# Patient Record
Sex: Male | Born: 1935 | Race: Black or African American | Hispanic: No | Marital: Married | Smoking: Former smoker
Health system: Southern US, Community
[De-identification: ages and names within clinical notes are randomized; demographics above are authoritative.]

## PROBLEM LIST (undated history)

## (undated) DIAGNOSIS — I2510 Atherosclerotic heart disease of native coronary artery without angina pectoris: Secondary | ICD-10-CM

## (undated) DIAGNOSIS — C801 Malignant (primary) neoplasm, unspecified: Secondary | ICD-10-CM

## (undated) HISTORY — PX: COLON SURGERY: SHX602

## (undated) HISTORY — PX: CORONARY ARTERY BYPASS GRAFT: SHX141

---

## 2015-11-02 DIAGNOSIS — Z85038 Personal history of other malignant neoplasm of large intestine: Secondary | ICD-10-CM | POA: Diagnosis not present

## 2015-12-07 DIAGNOSIS — C186 Malignant neoplasm of descending colon: Secondary | ICD-10-CM | POA: Diagnosis not present

## 2015-12-07 DIAGNOSIS — Z933 Colostomy status: Secondary | ICD-10-CM | POA: Diagnosis not present

## 2016-04-12 DIAGNOSIS — R970 Elevated carcinoembryonic antigen [CEA]: Secondary | ICD-10-CM | POA: Diagnosis not present

## 2016-04-12 DIAGNOSIS — Z85038 Personal history of other malignant neoplasm of large intestine: Secondary | ICD-10-CM | POA: Diagnosis not present

## 2016-04-27 DIAGNOSIS — C186 Malignant neoplasm of descending colon: Secondary | ICD-10-CM | POA: Diagnosis not present

## 2016-04-27 DIAGNOSIS — K6389 Other specified diseases of intestine: Secondary | ICD-10-CM | POA: Diagnosis not present

## 2016-06-08 DIAGNOSIS — C189 Malignant neoplasm of colon, unspecified: Secondary | ICD-10-CM | POA: Diagnosis not present

## 2017-01-01 DIAGNOSIS — K56609 Unspecified intestinal obstruction, unspecified as to partial versus complete obstruction: Secondary | ICD-10-CM

## 2017-01-01 DIAGNOSIS — I10 Essential (primary) hypertension: Secondary | ICD-10-CM | POA: Diagnosis not present

## 2017-01-01 DIAGNOSIS — E875 Hyperkalemia: Secondary | ICD-10-CM

## 2017-01-02 DIAGNOSIS — I10 Essential (primary) hypertension: Secondary | ICD-10-CM | POA: Diagnosis not present

## 2017-01-02 DIAGNOSIS — E875 Hyperkalemia: Secondary | ICD-10-CM | POA: Diagnosis not present

## 2017-01-02 DIAGNOSIS — K56609 Unspecified intestinal obstruction, unspecified as to partial versus complete obstruction: Secondary | ICD-10-CM | POA: Diagnosis not present

## 2017-01-03 DIAGNOSIS — I10 Essential (primary) hypertension: Secondary | ICD-10-CM | POA: Diagnosis not present

## 2017-01-03 DIAGNOSIS — K56609 Unspecified intestinal obstruction, unspecified as to partial versus complete obstruction: Secondary | ICD-10-CM | POA: Diagnosis not present

## 2017-01-03 DIAGNOSIS — E875 Hyperkalemia: Secondary | ICD-10-CM | POA: Diagnosis not present

## 2017-01-04 DIAGNOSIS — I10 Essential (primary) hypertension: Secondary | ICD-10-CM | POA: Diagnosis not present

## 2017-01-04 DIAGNOSIS — K56609 Unspecified intestinal obstruction, unspecified as to partial versus complete obstruction: Secondary | ICD-10-CM | POA: Diagnosis not present

## 2017-01-04 DIAGNOSIS — E875 Hyperkalemia: Secondary | ICD-10-CM | POA: Diagnosis not present

## 2017-01-05 DIAGNOSIS — I10 Essential (primary) hypertension: Secondary | ICD-10-CM | POA: Diagnosis not present

## 2017-01-05 DIAGNOSIS — K56609 Unspecified intestinal obstruction, unspecified as to partial versus complete obstruction: Secondary | ICD-10-CM | POA: Diagnosis not present

## 2017-01-05 DIAGNOSIS — E875 Hyperkalemia: Secondary | ICD-10-CM | POA: Diagnosis not present

## 2017-01-06 DIAGNOSIS — I10 Essential (primary) hypertension: Secondary | ICD-10-CM | POA: Diagnosis not present

## 2017-01-06 DIAGNOSIS — K56609 Unspecified intestinal obstruction, unspecified as to partial versus complete obstruction: Secondary | ICD-10-CM | POA: Diagnosis not present

## 2017-01-06 DIAGNOSIS — E875 Hyperkalemia: Secondary | ICD-10-CM | POA: Diagnosis not present

## 2017-01-07 DIAGNOSIS — K56609 Unspecified intestinal obstruction, unspecified as to partial versus complete obstruction: Secondary | ICD-10-CM | POA: Diagnosis not present

## 2017-01-07 DIAGNOSIS — E875 Hyperkalemia: Secondary | ICD-10-CM | POA: Diagnosis not present

## 2017-01-07 DIAGNOSIS — I10 Essential (primary) hypertension: Secondary | ICD-10-CM | POA: Diagnosis not present

## 2017-01-08 DIAGNOSIS — K56609 Unspecified intestinal obstruction, unspecified as to partial versus complete obstruction: Secondary | ICD-10-CM | POA: Diagnosis not present

## 2017-01-08 DIAGNOSIS — E875 Hyperkalemia: Secondary | ICD-10-CM | POA: Diagnosis not present

## 2017-01-08 DIAGNOSIS — I10 Essential (primary) hypertension: Secondary | ICD-10-CM | POA: Diagnosis not present

## 2017-01-10 DIAGNOSIS — E875 Hyperkalemia: Secondary | ICD-10-CM | POA: Diagnosis not present

## 2017-01-10 DIAGNOSIS — K56609 Unspecified intestinal obstruction, unspecified as to partial versus complete obstruction: Secondary | ICD-10-CM | POA: Diagnosis not present

## 2017-01-10 DIAGNOSIS — I10 Essential (primary) hypertension: Secondary | ICD-10-CM | POA: Diagnosis not present

## 2017-07-19 DIAGNOSIS — K56609 Unspecified intestinal obstruction, unspecified as to partial versus complete obstruction: Secondary | ICD-10-CM | POA: Diagnosis not present

## 2017-07-20 DIAGNOSIS — K56609 Unspecified intestinal obstruction, unspecified as to partial versus complete obstruction: Secondary | ICD-10-CM | POA: Diagnosis not present

## 2017-07-21 DIAGNOSIS — K56609 Unspecified intestinal obstruction, unspecified as to partial versus complete obstruction: Secondary | ICD-10-CM | POA: Diagnosis not present

## 2017-07-22 DIAGNOSIS — K56609 Unspecified intestinal obstruction, unspecified as to partial versus complete obstruction: Secondary | ICD-10-CM | POA: Diagnosis not present

## 2017-07-23 DIAGNOSIS — K56609 Unspecified intestinal obstruction, unspecified as to partial versus complete obstruction: Secondary | ICD-10-CM | POA: Diagnosis not present

## 2017-08-17 DIAGNOSIS — I2510 Atherosclerotic heart disease of native coronary artery without angina pectoris: Secondary | ICD-10-CM | POA: Diagnosis not present

## 2017-09-05 DIAGNOSIS — C189 Malignant neoplasm of colon, unspecified: Secondary | ICD-10-CM

## 2017-09-13 DIAGNOSIS — C786 Secondary malignant neoplasm of retroperitoneum and peritoneum: Secondary | ICD-10-CM | POA: Diagnosis not present

## 2017-09-13 DIAGNOSIS — C189 Malignant neoplasm of colon, unspecified: Secondary | ICD-10-CM | POA: Diagnosis not present

## 2017-09-21 DIAGNOSIS — C786 Secondary malignant neoplasm of retroperitoneum and peritoneum: Secondary | ICD-10-CM | POA: Diagnosis not present

## 2017-09-21 DIAGNOSIS — C189 Malignant neoplasm of colon, unspecified: Secondary | ICD-10-CM | POA: Diagnosis not present

## 2017-10-22 DIAGNOSIS — C786 Secondary malignant neoplasm of retroperitoneum and peritoneum: Secondary | ICD-10-CM | POA: Diagnosis not present

## 2017-10-22 DIAGNOSIS — C189 Malignant neoplasm of colon, unspecified: Secondary | ICD-10-CM | POA: Diagnosis not present

## 2017-10-22 DIAGNOSIS — K6289 Other specified diseases of anus and rectum: Secondary | ICD-10-CM | POA: Diagnosis not present

## 2017-11-05 DIAGNOSIS — C786 Secondary malignant neoplasm of retroperitoneum and peritoneum: Secondary | ICD-10-CM

## 2017-11-05 DIAGNOSIS — C189 Malignant neoplasm of colon, unspecified: Secondary | ICD-10-CM | POA: Diagnosis not present

## 2017-11-21 DIAGNOSIS — C786 Secondary malignant neoplasm of retroperitoneum and peritoneum: Secondary | ICD-10-CM

## 2017-11-21 DIAGNOSIS — C189 Malignant neoplasm of colon, unspecified: Secondary | ICD-10-CM | POA: Diagnosis not present

## 2017-11-21 DIAGNOSIS — R197 Diarrhea, unspecified: Secondary | ICD-10-CM | POA: Diagnosis not present

## 2017-11-21 DIAGNOSIS — Z933 Colostomy status: Secondary | ICD-10-CM

## 2017-11-21 DIAGNOSIS — E860 Dehydration: Secondary | ICD-10-CM | POA: Diagnosis not present

## 2017-12-12 DIAGNOSIS — C786 Secondary malignant neoplasm of retroperitoneum and peritoneum: Secondary | ICD-10-CM

## 2017-12-12 DIAGNOSIS — C189 Malignant neoplasm of colon, unspecified: Secondary | ICD-10-CM

## 2017-12-12 DIAGNOSIS — Z933 Colostomy status: Secondary | ICD-10-CM

## 2017-12-12 DIAGNOSIS — R197 Diarrhea, unspecified: Secondary | ICD-10-CM | POA: Diagnosis not present

## 2017-12-14 DIAGNOSIS — Z452 Encounter for adjustment and management of vascular access device: Secondary | ICD-10-CM | POA: Diagnosis not present

## 2017-12-14 DIAGNOSIS — C186 Malignant neoplasm of descending colon: Secondary | ICD-10-CM | POA: Diagnosis not present

## 2017-12-26 DIAGNOSIS — C186 Malignant neoplasm of descending colon: Secondary | ICD-10-CM | POA: Diagnosis not present

## 2017-12-26 DIAGNOSIS — D709 Neutropenia, unspecified: Secondary | ICD-10-CM | POA: Diagnosis not present

## 2017-12-26 DIAGNOSIS — D701 Agranulocytosis secondary to cancer chemotherapy: Secondary | ICD-10-CM | POA: Diagnosis not present

## 2017-12-26 DIAGNOSIS — C786 Secondary malignant neoplasm of retroperitoneum and peritoneum: Secondary | ICD-10-CM | POA: Diagnosis not present

## 2017-12-26 DIAGNOSIS — C189 Malignant neoplasm of colon, unspecified: Secondary | ICD-10-CM | POA: Diagnosis not present

## 2018-01-02 DIAGNOSIS — C186 Malignant neoplasm of descending colon: Secondary | ICD-10-CM | POA: Diagnosis not present

## 2018-01-03 DIAGNOSIS — Z5189 Encounter for other specified aftercare: Secondary | ICD-10-CM | POA: Diagnosis not present

## 2018-01-03 DIAGNOSIS — C186 Malignant neoplasm of descending colon: Secondary | ICD-10-CM | POA: Diagnosis not present

## 2018-01-09 DIAGNOSIS — Z5189 Encounter for other specified aftercare: Secondary | ICD-10-CM | POA: Diagnosis not present

## 2018-01-09 DIAGNOSIS — Z5111 Encounter for antineoplastic chemotherapy: Secondary | ICD-10-CM | POA: Diagnosis not present

## 2018-01-09 DIAGNOSIS — C186 Malignant neoplasm of descending colon: Secondary | ICD-10-CM | POA: Diagnosis not present

## 2018-01-11 DIAGNOSIS — C186 Malignant neoplasm of descending colon: Secondary | ICD-10-CM | POA: Diagnosis not present

## 2018-01-23 DIAGNOSIS — Z85038 Personal history of other malignant neoplasm of large intestine: Secondary | ICD-10-CM | POA: Diagnosis not present

## 2018-01-23 DIAGNOSIS — Z933 Colostomy status: Secondary | ICD-10-CM | POA: Diagnosis not present

## 2018-01-25 DIAGNOSIS — Z933 Colostomy status: Secondary | ICD-10-CM | POA: Diagnosis not present

## 2018-01-25 DIAGNOSIS — Z85038 Personal history of other malignant neoplasm of large intestine: Secondary | ICD-10-CM | POA: Diagnosis not present

## 2018-02-02 DIAGNOSIS — C186 Malignant neoplasm of descending colon: Secondary | ICD-10-CM | POA: Diagnosis not present

## 2018-02-04 DIAGNOSIS — D701 Agranulocytosis secondary to cancer chemotherapy: Secondary | ICD-10-CM | POA: Diagnosis not present

## 2018-02-04 DIAGNOSIS — D709 Neutropenia, unspecified: Secondary | ICD-10-CM | POA: Diagnosis not present

## 2018-02-04 DIAGNOSIS — C786 Secondary malignant neoplasm of retroperitoneum and peritoneum: Secondary | ICD-10-CM | POA: Diagnosis not present

## 2018-02-04 DIAGNOSIS — C186 Malignant neoplasm of descending colon: Secondary | ICD-10-CM | POA: Diagnosis not present

## 2018-02-04 DIAGNOSIS — C189 Malignant neoplasm of colon, unspecified: Secondary | ICD-10-CM | POA: Diagnosis not present

## 2018-02-06 DIAGNOSIS — Z5189 Encounter for other specified aftercare: Secondary | ICD-10-CM | POA: Diagnosis not present

## 2018-02-06 DIAGNOSIS — C186 Malignant neoplasm of descending colon: Secondary | ICD-10-CM | POA: Diagnosis not present

## 2018-02-25 DIAGNOSIS — C786 Secondary malignant neoplasm of retroperitoneum and peritoneum: Secondary | ICD-10-CM | POA: Diagnosis not present

## 2018-02-25 DIAGNOSIS — C186 Malignant neoplasm of descending colon: Secondary | ICD-10-CM | POA: Diagnosis not present

## 2018-02-25 DIAGNOSIS — D701 Agranulocytosis secondary to cancer chemotherapy: Secondary | ICD-10-CM | POA: Diagnosis not present

## 2018-02-25 DIAGNOSIS — C189 Malignant neoplasm of colon, unspecified: Secondary | ICD-10-CM | POA: Diagnosis not present

## 2018-02-25 DIAGNOSIS — Z79899 Other long term (current) drug therapy: Secondary | ICD-10-CM | POA: Diagnosis not present

## 2018-02-25 DIAGNOSIS — M25552 Pain in left hip: Secondary | ICD-10-CM | POA: Diagnosis not present

## 2018-02-27 DIAGNOSIS — Z5189 Encounter for other specified aftercare: Secondary | ICD-10-CM | POA: Diagnosis not present

## 2018-02-27 DIAGNOSIS — C186 Malignant neoplasm of descending colon: Secondary | ICD-10-CM | POA: Diagnosis not present

## 2018-03-04 DIAGNOSIS — C186 Malignant neoplasm of descending colon: Secondary | ICD-10-CM | POA: Diagnosis not present

## 2018-03-07 DIAGNOSIS — M25552 Pain in left hip: Secondary | ICD-10-CM | POA: Diagnosis not present

## 2018-03-07 DIAGNOSIS — I739 Peripheral vascular disease, unspecified: Secondary | ICD-10-CM | POA: Diagnosis not present

## 2018-03-07 DIAGNOSIS — M79605 Pain in left leg: Secondary | ICD-10-CM | POA: Diagnosis not present

## 2018-03-07 DIAGNOSIS — I10 Essential (primary) hypertension: Secondary | ICD-10-CM | POA: Diagnosis not present

## 2018-03-07 DIAGNOSIS — Z6825 Body mass index (BMI) 25.0-25.9, adult: Secondary | ICD-10-CM | POA: Diagnosis not present

## 2018-03-07 DIAGNOSIS — Z8546 Personal history of malignant neoplasm of prostate: Secondary | ICD-10-CM | POA: Diagnosis not present

## 2018-03-08 DIAGNOSIS — M47816 Spondylosis without myelopathy or radiculopathy, lumbar region: Secondary | ICD-10-CM | POA: Diagnosis not present

## 2018-03-08 DIAGNOSIS — M25552 Pain in left hip: Secondary | ICD-10-CM | POA: Diagnosis not present

## 2018-03-08 DIAGNOSIS — M160 Bilateral primary osteoarthritis of hip: Secondary | ICD-10-CM | POA: Diagnosis not present

## 2018-03-08 DIAGNOSIS — M79605 Pain in left leg: Secondary | ICD-10-CM | POA: Diagnosis not present

## 2018-03-15 DIAGNOSIS — C186 Malignant neoplasm of descending colon: Secondary | ICD-10-CM | POA: Diagnosis not present

## 2018-03-15 DIAGNOSIS — M47816 Spondylosis without myelopathy or radiculopathy, lumbar region: Secondary | ICD-10-CM | POA: Diagnosis not present

## 2018-03-15 DIAGNOSIS — Z933 Colostomy status: Secondary | ICD-10-CM | POA: Diagnosis not present

## 2018-03-15 DIAGNOSIS — I700 Atherosclerosis of aorta: Secondary | ICD-10-CM | POA: Diagnosis not present

## 2018-03-15 DIAGNOSIS — C189 Malignant neoplasm of colon, unspecified: Secondary | ICD-10-CM | POA: Diagnosis not present

## 2018-03-18 DIAGNOSIS — C189 Malignant neoplasm of colon, unspecified: Secondary | ICD-10-CM | POA: Diagnosis not present

## 2018-03-18 DIAGNOSIS — C786 Secondary malignant neoplasm of retroperitoneum and peritoneum: Secondary | ICD-10-CM | POA: Diagnosis not present

## 2018-03-18 DIAGNOSIS — K6289 Other specified diseases of anus and rectum: Secondary | ICD-10-CM | POA: Diagnosis not present

## 2018-03-18 DIAGNOSIS — C186 Malignant neoplasm of descending colon: Secondary | ICD-10-CM | POA: Diagnosis not present

## 2018-03-20 DIAGNOSIS — Z5189 Encounter for other specified aftercare: Secondary | ICD-10-CM | POA: Diagnosis not present

## 2018-03-20 DIAGNOSIS — C186 Malignant neoplasm of descending colon: Secondary | ICD-10-CM | POA: Diagnosis not present

## 2018-04-04 DIAGNOSIS — Z8546 Personal history of malignant neoplasm of prostate: Secondary | ICD-10-CM | POA: Diagnosis not present

## 2018-04-04 DIAGNOSIS — Z6824 Body mass index (BMI) 24.0-24.9, adult: Secondary | ICD-10-CM | POA: Diagnosis not present

## 2018-04-04 DIAGNOSIS — Z0289 Encounter for other administrative examinations: Secondary | ICD-10-CM | POA: Diagnosis not present

## 2018-04-04 DIAGNOSIS — L400 Psoriasis vulgaris: Secondary | ICD-10-CM | POA: Diagnosis not present

## 2018-04-04 DIAGNOSIS — C186 Malignant neoplasm of descending colon: Secondary | ICD-10-CM | POA: Diagnosis not present

## 2018-04-04 DIAGNOSIS — I10 Essential (primary) hypertension: Secondary | ICD-10-CM | POA: Diagnosis not present

## 2018-04-05 DIAGNOSIS — M1612 Unilateral primary osteoarthritis, left hip: Secondary | ICD-10-CM | POA: Diagnosis not present

## 2018-04-05 DIAGNOSIS — M79605 Pain in left leg: Secondary | ICD-10-CM | POA: Diagnosis not present

## 2018-04-08 DIAGNOSIS — L400 Psoriasis vulgaris: Secondary | ICD-10-CM | POA: Diagnosis not present

## 2018-04-08 DIAGNOSIS — R42 Dizziness and giddiness: Secondary | ICD-10-CM | POA: Diagnosis not present

## 2018-04-08 DIAGNOSIS — R944 Abnormal results of kidney function studies: Secondary | ICD-10-CM | POA: Diagnosis not present

## 2018-04-08 DIAGNOSIS — C186 Malignant neoplasm of descending colon: Secondary | ICD-10-CM | POA: Diagnosis not present

## 2018-04-08 DIAGNOSIS — C189 Malignant neoplasm of colon, unspecified: Secondary | ICD-10-CM | POA: Diagnosis not present

## 2018-04-08 DIAGNOSIS — C786 Secondary malignant neoplasm of retroperitoneum and peritoneum: Secondary | ICD-10-CM | POA: Diagnosis not present

## 2018-04-10 DIAGNOSIS — C186 Malignant neoplasm of descending colon: Secondary | ICD-10-CM | POA: Diagnosis not present

## 2018-04-10 DIAGNOSIS — Z5189 Encounter for other specified aftercare: Secondary | ICD-10-CM | POA: Diagnosis not present

## 2018-04-15 DIAGNOSIS — M1612 Unilateral primary osteoarthritis, left hip: Secondary | ICD-10-CM | POA: Diagnosis not present

## 2018-04-18 DIAGNOSIS — Z933 Colostomy status: Secondary | ICD-10-CM | POA: Diagnosis not present

## 2018-04-18 DIAGNOSIS — Z85038 Personal history of other malignant neoplasm of large intestine: Secondary | ICD-10-CM | POA: Diagnosis not present

## 2018-04-29 DIAGNOSIS — C786 Secondary malignant neoplasm of retroperitoneum and peritoneum: Secondary | ICD-10-CM | POA: Diagnosis not present

## 2018-04-29 DIAGNOSIS — C186 Malignant neoplasm of descending colon: Secondary | ICD-10-CM | POA: Diagnosis not present

## 2018-04-29 DIAGNOSIS — K521 Toxic gastroenteritis and colitis: Secondary | ICD-10-CM | POA: Diagnosis not present

## 2018-04-29 DIAGNOSIS — C189 Malignant neoplasm of colon, unspecified: Secondary | ICD-10-CM | POA: Diagnosis not present

## 2018-04-29 DIAGNOSIS — Z933 Colostomy status: Secondary | ICD-10-CM | POA: Diagnosis not present

## 2018-04-29 DIAGNOSIS — Z5189 Encounter for other specified aftercare: Secondary | ICD-10-CM | POA: Diagnosis not present

## 2018-04-29 DIAGNOSIS — R197 Diarrhea, unspecified: Secondary | ICD-10-CM | POA: Diagnosis not present

## 2018-05-01 DIAGNOSIS — Z5189 Encounter for other specified aftercare: Secondary | ICD-10-CM | POA: Diagnosis not present

## 2018-05-01 DIAGNOSIS — C186 Malignant neoplasm of descending colon: Secondary | ICD-10-CM | POA: Diagnosis not present

## 2018-05-04 DIAGNOSIS — C186 Malignant neoplasm of descending colon: Secondary | ICD-10-CM | POA: Diagnosis not present

## 2018-05-27 DIAGNOSIS — C186 Malignant neoplasm of descending colon: Secondary | ICD-10-CM | POA: Diagnosis not present

## 2018-05-27 DIAGNOSIS — C189 Malignant neoplasm of colon, unspecified: Secondary | ICD-10-CM | POA: Diagnosis not present

## 2018-05-27 DIAGNOSIS — C786 Secondary malignant neoplasm of retroperitoneum and peritoneum: Secondary | ICD-10-CM | POA: Diagnosis not present

## 2018-05-28 DIAGNOSIS — I10 Essential (primary) hypertension: Secondary | ICD-10-CM | POA: Diagnosis not present

## 2018-05-28 DIAGNOSIS — Z951 Presence of aortocoronary bypass graft: Secondary | ICD-10-CM | POA: Diagnosis not present

## 2018-05-28 DIAGNOSIS — C186 Malignant neoplasm of descending colon: Secondary | ICD-10-CM | POA: Diagnosis not present

## 2018-05-28 DIAGNOSIS — E782 Mixed hyperlipidemia: Secondary | ICD-10-CM | POA: Diagnosis not present

## 2018-05-28 DIAGNOSIS — I2510 Atherosclerotic heart disease of native coronary artery without angina pectoris: Secondary | ICD-10-CM | POA: Diagnosis not present

## 2018-05-28 DIAGNOSIS — Z7982 Long term (current) use of aspirin: Secondary | ICD-10-CM | POA: Diagnosis not present

## 2018-05-29 DIAGNOSIS — C186 Malignant neoplasm of descending colon: Secondary | ICD-10-CM | POA: Diagnosis not present

## 2018-05-29 DIAGNOSIS — Z5189 Encounter for other specified aftercare: Secondary | ICD-10-CM | POA: Diagnosis not present

## 2018-06-04 DIAGNOSIS — C186 Malignant neoplasm of descending colon: Secondary | ICD-10-CM | POA: Diagnosis not present

## 2018-06-12 DIAGNOSIS — H401111 Primary open-angle glaucoma, right eye, mild stage: Secondary | ICD-10-CM | POA: Diagnosis not present

## 2018-06-14 DIAGNOSIS — C189 Malignant neoplasm of colon, unspecified: Secondary | ICD-10-CM | POA: Diagnosis not present

## 2018-06-14 DIAGNOSIS — Z77090 Contact with and (suspected) exposure to asbestos: Secondary | ICD-10-CM | POA: Diagnosis not present

## 2018-06-14 DIAGNOSIS — J948 Other specified pleural conditions: Secondary | ICD-10-CM | POA: Diagnosis not present

## 2018-06-14 DIAGNOSIS — C186 Malignant neoplasm of descending colon: Secondary | ICD-10-CM | POA: Diagnosis not present

## 2018-06-17 DIAGNOSIS — C786 Secondary malignant neoplasm of retroperitoneum and peritoneum: Secondary | ICD-10-CM | POA: Diagnosis not present

## 2018-06-17 DIAGNOSIS — Z515 Encounter for palliative care: Secondary | ICD-10-CM | POA: Diagnosis not present

## 2018-06-17 DIAGNOSIS — C189 Malignant neoplasm of colon, unspecified: Secondary | ICD-10-CM | POA: Diagnosis not present

## 2018-06-17 DIAGNOSIS — R970 Elevated carcinoembryonic antigen [CEA]: Secondary | ICD-10-CM | POA: Diagnosis not present

## 2018-06-17 DIAGNOSIS — C186 Malignant neoplasm of descending colon: Secondary | ICD-10-CM | POA: Diagnosis not present

## 2018-06-19 DIAGNOSIS — Z5189 Encounter for other specified aftercare: Secondary | ICD-10-CM | POA: Diagnosis not present

## 2018-06-19 DIAGNOSIS — C186 Malignant neoplasm of descending colon: Secondary | ICD-10-CM | POA: Diagnosis not present

## 2018-06-27 DIAGNOSIS — H43811 Vitreous degeneration, right eye: Secondary | ICD-10-CM | POA: Diagnosis not present

## 2018-06-27 DIAGNOSIS — H4311 Vitreous hemorrhage, right eye: Secondary | ICD-10-CM | POA: Diagnosis not present

## 2018-06-27 DIAGNOSIS — H43813 Vitreous degeneration, bilateral: Secondary | ICD-10-CM | POA: Diagnosis not present

## 2018-06-27 DIAGNOSIS — H43812 Vitreous degeneration, left eye: Secondary | ICD-10-CM | POA: Diagnosis not present

## 2018-07-05 DIAGNOSIS — C186 Malignant neoplasm of descending colon: Secondary | ICD-10-CM | POA: Diagnosis not present

## 2018-07-10 DIAGNOSIS — C189 Malignant neoplasm of colon, unspecified: Secondary | ICD-10-CM | POA: Diagnosis not present

## 2018-07-10 DIAGNOSIS — C786 Secondary malignant neoplasm of retroperitoneum and peritoneum: Secondary | ICD-10-CM | POA: Diagnosis not present

## 2018-07-10 DIAGNOSIS — C186 Malignant neoplasm of descending colon: Secondary | ICD-10-CM | POA: Diagnosis not present

## 2018-07-11 DIAGNOSIS — H35031 Hypertensive retinopathy, right eye: Secondary | ICD-10-CM | POA: Diagnosis not present

## 2018-07-11 DIAGNOSIS — H4311 Vitreous hemorrhage, right eye: Secondary | ICD-10-CM | POA: Diagnosis not present

## 2018-07-11 DIAGNOSIS — H43811 Vitreous degeneration, right eye: Secondary | ICD-10-CM | POA: Diagnosis not present

## 2018-07-12 DIAGNOSIS — C186 Malignant neoplasm of descending colon: Secondary | ICD-10-CM | POA: Diagnosis not present

## 2018-07-12 DIAGNOSIS — Z5189 Encounter for other specified aftercare: Secondary | ICD-10-CM | POA: Diagnosis not present

## 2018-07-31 DIAGNOSIS — Z0001 Encounter for general adult medical examination with abnormal findings: Secondary | ICD-10-CM | POA: Diagnosis not present

## 2018-07-31 DIAGNOSIS — C786 Secondary malignant neoplasm of retroperitoneum and peritoneum: Secondary | ICD-10-CM

## 2018-07-31 DIAGNOSIS — D649 Anemia, unspecified: Secondary | ICD-10-CM | POA: Diagnosis not present

## 2018-07-31 DIAGNOSIS — C186 Malignant neoplasm of descending colon: Secondary | ICD-10-CM | POA: Diagnosis not present

## 2018-07-31 DIAGNOSIS — C189 Malignant neoplasm of colon, unspecified: Secondary | ICD-10-CM

## 2018-08-02 DIAGNOSIS — C186 Malignant neoplasm of descending colon: Secondary | ICD-10-CM | POA: Diagnosis not present

## 2018-08-02 DIAGNOSIS — Z5189 Encounter for other specified aftercare: Secondary | ICD-10-CM | POA: Diagnosis not present

## 2018-08-02 DIAGNOSIS — Z23 Encounter for immunization: Secondary | ICD-10-CM | POA: Diagnosis not present

## 2018-08-03 DIAGNOSIS — C186 Malignant neoplasm of descending colon: Secondary | ICD-10-CM | POA: Diagnosis not present

## 2018-08-28 DIAGNOSIS — C186 Malignant neoplasm of descending colon: Secondary | ICD-10-CM | POA: Diagnosis not present

## 2018-08-28 DIAGNOSIS — E860 Dehydration: Secondary | ICD-10-CM | POA: Diagnosis not present

## 2018-08-30 DIAGNOSIS — C186 Malignant neoplasm of descending colon: Secondary | ICD-10-CM | POA: Diagnosis not present

## 2018-08-30 DIAGNOSIS — Z5189 Encounter for other specified aftercare: Secondary | ICD-10-CM | POA: Diagnosis not present

## 2018-09-03 DIAGNOSIS — C186 Malignant neoplasm of descending colon: Secondary | ICD-10-CM | POA: Diagnosis not present

## 2018-09-16 DIAGNOSIS — C189 Malignant neoplasm of colon, unspecified: Secondary | ICD-10-CM | POA: Diagnosis not present

## 2018-09-16 DIAGNOSIS — C186 Malignant neoplasm of descending colon: Secondary | ICD-10-CM | POA: Diagnosis not present

## 2018-09-17 DIAGNOSIS — C186 Malignant neoplasm of descending colon: Secondary | ICD-10-CM | POA: Diagnosis not present

## 2018-09-17 DIAGNOSIS — C786 Secondary malignant neoplasm of retroperitoneum and peritoneum: Secondary | ICD-10-CM | POA: Diagnosis not present

## 2018-09-17 DIAGNOSIS — C189 Malignant neoplasm of colon, unspecified: Secondary | ICD-10-CM | POA: Diagnosis not present

## 2018-09-17 DIAGNOSIS — R978 Other abnormal tumor markers: Secondary | ICD-10-CM | POA: Diagnosis not present

## 2018-09-19 DIAGNOSIS — C186 Malignant neoplasm of descending colon: Secondary | ICD-10-CM | POA: Diagnosis not present

## 2018-09-19 DIAGNOSIS — D649 Anemia, unspecified: Secondary | ICD-10-CM | POA: Diagnosis not present

## 2018-09-19 DIAGNOSIS — Z5189 Encounter for other specified aftercare: Secondary | ICD-10-CM | POA: Diagnosis not present

## 2018-10-03 DIAGNOSIS — C186 Malignant neoplasm of descending colon: Secondary | ICD-10-CM | POA: Diagnosis not present

## 2018-10-14 DIAGNOSIS — C189 Malignant neoplasm of colon, unspecified: Secondary | ICD-10-CM

## 2018-10-14 DIAGNOSIS — C786 Secondary malignant neoplasm of retroperitoneum and peritoneum: Secondary | ICD-10-CM

## 2018-10-14 DIAGNOSIS — C186 Malignant neoplasm of descending colon: Secondary | ICD-10-CM | POA: Diagnosis not present

## 2018-10-16 DIAGNOSIS — Z5189 Encounter for other specified aftercare: Secondary | ICD-10-CM | POA: Diagnosis not present

## 2018-10-16 DIAGNOSIS — C186 Malignant neoplasm of descending colon: Secondary | ICD-10-CM | POA: Diagnosis not present

## 2018-11-04 DIAGNOSIS — C786 Secondary malignant neoplasm of retroperitoneum and peritoneum: Secondary | ICD-10-CM | POA: Diagnosis not present

## 2018-11-04 DIAGNOSIS — C189 Malignant neoplasm of colon, unspecified: Secondary | ICD-10-CM | POA: Diagnosis not present

## 2018-11-04 DIAGNOSIS — Z933 Colostomy status: Secondary | ICD-10-CM | POA: Diagnosis not present

## 2018-11-04 DIAGNOSIS — C186 Malignant neoplasm of descending colon: Secondary | ICD-10-CM | POA: Diagnosis not present

## 2018-11-04 DIAGNOSIS — K922 Gastrointestinal hemorrhage, unspecified: Secondary | ICD-10-CM | POA: Diagnosis not present

## 2018-11-06 DIAGNOSIS — I130 Hypertensive heart and chronic kidney disease with heart failure and stage 1 through stage 4 chronic kidney disease, or unspecified chronic kidney disease: Secondary | ICD-10-CM | POA: Diagnosis not present

## 2018-11-06 DIAGNOSIS — N170 Acute kidney failure with tubular necrosis: Secondary | ICD-10-CM | POA: Diagnosis not present

## 2018-11-06 DIAGNOSIS — I5022 Chronic systolic (congestive) heart failure: Secondary | ICD-10-CM | POA: Diagnosis not present

## 2018-11-06 DIAGNOSIS — R918 Other nonspecific abnormal finding of lung field: Secondary | ICD-10-CM | POA: Diagnosis not present

## 2018-11-06 DIAGNOSIS — K922 Gastrointestinal hemorrhage, unspecified: Secondary | ICD-10-CM | POA: Diagnosis not present

## 2018-11-06 DIAGNOSIS — K264 Chronic or unspecified duodenal ulcer with hemorrhage: Secondary | ICD-10-CM | POA: Diagnosis not present

## 2018-11-06 DIAGNOSIS — C185 Malignant neoplasm of splenic flexure: Secondary | ICD-10-CM | POA: Diagnosis not present

## 2018-11-06 DIAGNOSIS — D649 Anemia, unspecified: Secondary | ICD-10-CM | POA: Diagnosis not present

## 2018-11-06 DIAGNOSIS — Z7982 Long term (current) use of aspirin: Secondary | ICD-10-CM | POA: Diagnosis not present

## 2018-11-06 DIAGNOSIS — K269 Duodenal ulcer, unspecified as acute or chronic, without hemorrhage or perforation: Secondary | ICD-10-CM | POA: Diagnosis not present

## 2018-11-06 DIAGNOSIS — I10 Essential (primary) hypertension: Secondary | ICD-10-CM | POA: Diagnosis not present

## 2018-11-06 DIAGNOSIS — K921 Melena: Secondary | ICD-10-CM | POA: Diagnosis not present

## 2018-11-06 DIAGNOSIS — E785 Hyperlipidemia, unspecified: Secondary | ICD-10-CM | POA: Diagnosis not present

## 2018-11-06 DIAGNOSIS — R531 Weakness: Secondary | ICD-10-CM | POA: Diagnosis not present

## 2018-11-06 DIAGNOSIS — I2510 Atherosclerotic heart disease of native coronary artery without angina pectoris: Secondary | ICD-10-CM | POA: Diagnosis not present

## 2018-11-06 DIAGNOSIS — Z85038 Personal history of other malignant neoplasm of large intestine: Secondary | ICD-10-CM | POA: Diagnosis not present

## 2018-11-06 DIAGNOSIS — D62 Acute posthemorrhagic anemia: Secondary | ICD-10-CM | POA: Diagnosis not present

## 2018-11-14 DIAGNOSIS — Z933 Colostomy status: Secondary | ICD-10-CM | POA: Diagnosis not present

## 2018-11-14 DIAGNOSIS — Z85038 Personal history of other malignant neoplasm of large intestine: Secondary | ICD-10-CM | POA: Diagnosis not present

## 2018-11-20 DIAGNOSIS — K269 Duodenal ulcer, unspecified as acute or chronic, without hemorrhage or perforation: Secondary | ICD-10-CM | POA: Diagnosis not present

## 2018-11-26 DIAGNOSIS — R109 Unspecified abdominal pain: Secondary | ICD-10-CM | POA: Diagnosis not present

## 2018-11-26 DIAGNOSIS — R1084 Generalized abdominal pain: Secondary | ICD-10-CM | POA: Diagnosis not present

## 2018-11-26 DIAGNOSIS — C186 Malignant neoplasm of descending colon: Secondary | ICD-10-CM | POA: Diagnosis not present

## 2018-11-27 DIAGNOSIS — Z933 Colostomy status: Secondary | ICD-10-CM | POA: Diagnosis not present

## 2018-11-27 DIAGNOSIS — Z85038 Personal history of other malignant neoplasm of large intestine: Secondary | ICD-10-CM | POA: Diagnosis not present

## 2018-11-30 DIAGNOSIS — M25462 Effusion, left knee: Secondary | ICD-10-CM | POA: Diagnosis not present

## 2018-11-30 DIAGNOSIS — I252 Old myocardial infarction: Secondary | ICD-10-CM | POA: Diagnosis not present

## 2018-11-30 DIAGNOSIS — M25562 Pain in left knee: Secondary | ICD-10-CM | POA: Diagnosis not present

## 2018-11-30 DIAGNOSIS — M79652 Pain in left thigh: Secondary | ICD-10-CM | POA: Diagnosis not present

## 2018-11-30 DIAGNOSIS — M5136 Other intervertebral disc degeneration, lumbar region: Secondary | ICD-10-CM | POA: Diagnosis not present

## 2018-11-30 DIAGNOSIS — Z87891 Personal history of nicotine dependence: Secondary | ICD-10-CM | POA: Diagnosis not present

## 2018-11-30 DIAGNOSIS — Z79899 Other long term (current) drug therapy: Secondary | ICD-10-CM | POA: Diagnosis not present

## 2018-11-30 DIAGNOSIS — I2510 Atherosclerotic heart disease of native coronary artery without angina pectoris: Secondary | ICD-10-CM | POA: Diagnosis not present

## 2018-11-30 DIAGNOSIS — Z8673 Personal history of transient ischemic attack (TIA), and cerebral infarction without residual deficits: Secondary | ICD-10-CM | POA: Diagnosis not present

## 2018-11-30 DIAGNOSIS — I129 Hypertensive chronic kidney disease with stage 1 through stage 4 chronic kidney disease, or unspecified chronic kidney disease: Secondary | ICD-10-CM | POA: Diagnosis not present

## 2018-11-30 DIAGNOSIS — N189 Chronic kidney disease, unspecified: Secondary | ICD-10-CM | POA: Diagnosis not present

## 2018-12-03 DIAGNOSIS — C186 Malignant neoplasm of descending colon: Secondary | ICD-10-CM | POA: Diagnosis not present

## 2018-12-05 DIAGNOSIS — M79605 Pain in left leg: Secondary | ICD-10-CM | POA: Diagnosis not present

## 2018-12-05 DIAGNOSIS — C186 Malignant neoplasm of descending colon: Secondary | ICD-10-CM | POA: Diagnosis not present

## 2018-12-05 DIAGNOSIS — Z452 Encounter for adjustment and management of vascular access device: Secondary | ICD-10-CM | POA: Diagnosis not present

## 2018-12-05 DIAGNOSIS — M1612 Unilateral primary osteoarthritis, left hip: Secondary | ICD-10-CM | POA: Diagnosis not present

## 2018-12-06 DIAGNOSIS — Z5189 Encounter for other specified aftercare: Secondary | ICD-10-CM | POA: Diagnosis not present

## 2018-12-06 DIAGNOSIS — C186 Malignant neoplasm of descending colon: Secondary | ICD-10-CM | POA: Diagnosis not present

## 2018-12-24 DIAGNOSIS — C186 Malignant neoplasm of descending colon: Secondary | ICD-10-CM | POA: Diagnosis not present

## 2018-12-24 DIAGNOSIS — Z5189 Encounter for other specified aftercare: Secondary | ICD-10-CM | POA: Diagnosis not present

## 2018-12-24 DIAGNOSIS — Z515 Encounter for palliative care: Secondary | ICD-10-CM | POA: Diagnosis not present

## 2018-12-26 DIAGNOSIS — Z452 Encounter for adjustment and management of vascular access device: Secondary | ICD-10-CM | POA: Diagnosis not present

## 2018-12-26 DIAGNOSIS — C186 Malignant neoplasm of descending colon: Secondary | ICD-10-CM | POA: Diagnosis not present

## 2018-12-27 DIAGNOSIS — C186 Malignant neoplasm of descending colon: Secondary | ICD-10-CM | POA: Diagnosis not present

## 2018-12-27 DIAGNOSIS — Z5189 Encounter for other specified aftercare: Secondary | ICD-10-CM | POA: Diagnosis not present

## 2019-01-03 DIAGNOSIS — C186 Malignant neoplasm of descending colon: Secondary | ICD-10-CM | POA: Diagnosis not present

## 2019-01-14 DIAGNOSIS — C186 Malignant neoplasm of descending colon: Secondary | ICD-10-CM | POA: Diagnosis not present

## 2019-01-16 DIAGNOSIS — C186 Malignant neoplasm of descending colon: Secondary | ICD-10-CM | POA: Diagnosis not present

## 2019-01-16 DIAGNOSIS — Z452 Encounter for adjustment and management of vascular access device: Secondary | ICD-10-CM | POA: Diagnosis not present

## 2019-01-17 DIAGNOSIS — Z1589 Genetic susceptibility to other disease: Secondary | ICD-10-CM | POA: Diagnosis not present

## 2019-01-17 DIAGNOSIS — C186 Malignant neoplasm of descending colon: Secondary | ICD-10-CM | POA: Diagnosis not present

## 2019-02-03 DIAGNOSIS — C189 Malignant neoplasm of colon, unspecified: Secondary | ICD-10-CM | POA: Diagnosis not present

## 2019-02-03 DIAGNOSIS — C186 Malignant neoplasm of descending colon: Secondary | ICD-10-CM | POA: Diagnosis not present

## 2019-02-04 DIAGNOSIS — C186 Malignant neoplasm of descending colon: Secondary | ICD-10-CM | POA: Diagnosis not present

## 2019-02-05 DIAGNOSIS — H401111 Primary open-angle glaucoma, right eye, mild stage: Secondary | ICD-10-CM | POA: Diagnosis not present

## 2019-02-08 DIAGNOSIS — E440 Moderate protein-calorie malnutrition: Secondary | ICD-10-CM | POA: Diagnosis not present

## 2019-02-08 DIAGNOSIS — C186 Malignant neoplasm of descending colon: Secondary | ICD-10-CM | POA: Diagnosis not present

## 2019-02-08 DIAGNOSIS — E876 Hypokalemia: Secondary | ICD-10-CM | POA: Diagnosis not present

## 2019-02-08 DIAGNOSIS — N289 Disorder of kidney and ureter, unspecified: Secondary | ICD-10-CM | POA: Diagnosis not present

## 2019-02-08 DIAGNOSIS — K56609 Unspecified intestinal obstruction, unspecified as to partial versus complete obstruction: Secondary | ICD-10-CM | POA: Diagnosis not present

## 2019-02-08 DIAGNOSIS — R109 Unspecified abdominal pain: Secondary | ICD-10-CM | POA: Diagnosis not present

## 2019-02-08 DIAGNOSIS — C189 Malignant neoplasm of colon, unspecified: Secondary | ICD-10-CM | POA: Diagnosis not present

## 2019-02-08 DIAGNOSIS — Z933 Colostomy status: Secondary | ICD-10-CM | POA: Diagnosis not present

## 2019-02-08 DIAGNOSIS — Z8673 Personal history of transient ischemic attack (TIA), and cerebral infarction without residual deficits: Secondary | ICD-10-CM | POA: Diagnosis not present

## 2019-02-08 DIAGNOSIS — C786 Secondary malignant neoplasm of retroperitoneum and peritoneum: Secondary | ICD-10-CM | POA: Diagnosis not present

## 2019-02-08 DIAGNOSIS — R1110 Vomiting, unspecified: Secondary | ICD-10-CM | POA: Diagnosis not present

## 2019-02-08 DIAGNOSIS — K565 Intestinal adhesions [bands], unspecified as to partial versus complete obstruction: Secondary | ICD-10-CM | POA: Diagnosis not present

## 2019-02-09 DIAGNOSIS — K56609 Unspecified intestinal obstruction, unspecified as to partial versus complete obstruction: Secondary | ICD-10-CM | POA: Diagnosis not present

## 2019-02-09 DIAGNOSIS — R109 Unspecified abdominal pain: Secondary | ICD-10-CM | POA: Diagnosis not present

## 2019-02-09 DIAGNOSIS — R1110 Vomiting, unspecified: Secondary | ICD-10-CM | POA: Diagnosis not present

## 2019-02-09 DIAGNOSIS — C786 Secondary malignant neoplasm of retroperitoneum and peritoneum: Secondary | ICD-10-CM | POA: Diagnosis not present

## 2019-02-09 DIAGNOSIS — C189 Malignant neoplasm of colon, unspecified: Secondary | ICD-10-CM | POA: Diagnosis not present

## 2019-02-09 DIAGNOSIS — K565 Intestinal adhesions [bands], unspecified as to partial versus complete obstruction: Secondary | ICD-10-CM | POA: Diagnosis not present

## 2019-02-09 DIAGNOSIS — C186 Malignant neoplasm of descending colon: Secondary | ICD-10-CM

## 2019-02-09 DIAGNOSIS — E440 Moderate protein-calorie malnutrition: Secondary | ICD-10-CM | POA: Diagnosis not present

## 2019-02-10 DIAGNOSIS — E440 Moderate protein-calorie malnutrition: Secondary | ICD-10-CM | POA: Diagnosis not present

## 2019-02-10 DIAGNOSIS — K565 Intestinal adhesions [bands], unspecified as to partial versus complete obstruction: Secondary | ICD-10-CM | POA: Diagnosis not present

## 2019-02-10 DIAGNOSIS — R109 Unspecified abdominal pain: Secondary | ICD-10-CM | POA: Diagnosis not present

## 2019-02-10 DIAGNOSIS — C786 Secondary malignant neoplasm of retroperitoneum and peritoneum: Secondary | ICD-10-CM | POA: Diagnosis not present

## 2019-02-11 DIAGNOSIS — Z933 Colostomy status: Secondary | ICD-10-CM | POA: Diagnosis not present

## 2019-02-11 DIAGNOSIS — C786 Secondary malignant neoplasm of retroperitoneum and peritoneum: Secondary | ICD-10-CM | POA: Diagnosis not present

## 2019-02-11 DIAGNOSIS — R109 Unspecified abdominal pain: Secondary | ICD-10-CM | POA: Diagnosis not present

## 2019-02-11 DIAGNOSIS — C189 Malignant neoplasm of colon, unspecified: Secondary | ICD-10-CM | POA: Diagnosis not present

## 2019-02-11 DIAGNOSIS — Z8673 Personal history of transient ischemic attack (TIA), and cerebral infarction without residual deficits: Secondary | ICD-10-CM | POA: Diagnosis not present

## 2019-02-11 DIAGNOSIS — E876 Hypokalemia: Secondary | ICD-10-CM | POA: Diagnosis not present

## 2019-02-11 DIAGNOSIS — E440 Moderate protein-calorie malnutrition: Secondary | ICD-10-CM | POA: Diagnosis not present

## 2019-02-11 DIAGNOSIS — N289 Disorder of kidney and ureter, unspecified: Secondary | ICD-10-CM | POA: Diagnosis not present

## 2019-02-11 DIAGNOSIS — K565 Intestinal adhesions [bands], unspecified as to partial versus complete obstruction: Secondary | ICD-10-CM | POA: Diagnosis not present

## 2019-02-17 DIAGNOSIS — Z933 Colostomy status: Secondary | ICD-10-CM | POA: Diagnosis not present

## 2019-02-17 DIAGNOSIS — Z85038 Personal history of other malignant neoplasm of large intestine: Secondary | ICD-10-CM | POA: Diagnosis not present

## 2019-02-18 DIAGNOSIS — C186 Malignant neoplasm of descending colon: Secondary | ICD-10-CM | POA: Diagnosis not present

## 2019-02-19 DIAGNOSIS — D649 Anemia, unspecified: Secondary | ICD-10-CM | POA: Diagnosis not present

## 2019-02-19 DIAGNOSIS — C186 Malignant neoplasm of descending colon: Secondary | ICD-10-CM | POA: Diagnosis not present

## 2019-02-19 DIAGNOSIS — Z515 Encounter for palliative care: Secondary | ICD-10-CM | POA: Diagnosis not present

## 2019-02-24 DIAGNOSIS — Z5111 Encounter for antineoplastic chemotherapy: Secondary | ICD-10-CM | POA: Diagnosis not present

## 2019-02-24 DIAGNOSIS — C186 Malignant neoplasm of descending colon: Secondary | ICD-10-CM | POA: Diagnosis not present

## 2019-02-26 DIAGNOSIS — Z452 Encounter for adjustment and management of vascular access device: Secondary | ICD-10-CM | POA: Diagnosis not present

## 2019-02-26 DIAGNOSIS — C186 Malignant neoplasm of descending colon: Secondary | ICD-10-CM | POA: Diagnosis not present

## 2019-02-27 DIAGNOSIS — Z5189 Encounter for other specified aftercare: Secondary | ICD-10-CM | POA: Diagnosis not present

## 2019-02-27 DIAGNOSIS — C186 Malignant neoplasm of descending colon: Secondary | ICD-10-CM | POA: Diagnosis not present

## 2019-03-05 DIAGNOSIS — C186 Malignant neoplasm of descending colon: Secondary | ICD-10-CM | POA: Diagnosis not present

## 2019-03-17 DIAGNOSIS — C186 Malignant neoplasm of descending colon: Secondary | ICD-10-CM | POA: Diagnosis not present

## 2019-03-19 DIAGNOSIS — Z452 Encounter for adjustment and management of vascular access device: Secondary | ICD-10-CM | POA: Diagnosis not present

## 2019-03-19 DIAGNOSIS — C186 Malignant neoplasm of descending colon: Secondary | ICD-10-CM | POA: Diagnosis not present

## 2019-03-20 DIAGNOSIS — C186 Malignant neoplasm of descending colon: Secondary | ICD-10-CM | POA: Diagnosis not present

## 2019-03-20 DIAGNOSIS — Z5189 Encounter for other specified aftercare: Secondary | ICD-10-CM | POA: Diagnosis not present

## 2019-04-03 DIAGNOSIS — Z8719 Personal history of other diseases of the digestive system: Secondary | ICD-10-CM | POA: Diagnosis not present

## 2019-04-09 DIAGNOSIS — Z85038 Personal history of other malignant neoplasm of large intestine: Secondary | ICD-10-CM | POA: Diagnosis not present

## 2019-04-09 DIAGNOSIS — Z933 Colostomy status: Secondary | ICD-10-CM | POA: Diagnosis not present

## 2019-04-14 DIAGNOSIS — C186 Malignant neoplasm of descending colon: Secondary | ICD-10-CM | POA: Diagnosis not present

## 2019-04-14 DIAGNOSIS — Z79899 Other long term (current) drug therapy: Secondary | ICD-10-CM | POA: Diagnosis not present

## 2019-04-15 DIAGNOSIS — Z933 Colostomy status: Secondary | ICD-10-CM | POA: Diagnosis not present

## 2019-04-15 DIAGNOSIS — Z85038 Personal history of other malignant neoplasm of large intestine: Secondary | ICD-10-CM | POA: Diagnosis not present

## 2019-04-16 DIAGNOSIS — C20 Malignant neoplasm of rectum: Secondary | ICD-10-CM | POA: Diagnosis not present

## 2019-04-16 DIAGNOSIS — Z452 Encounter for adjustment and management of vascular access device: Secondary | ICD-10-CM | POA: Diagnosis not present

## 2019-04-17 DIAGNOSIS — C186 Malignant neoplasm of descending colon: Secondary | ICD-10-CM | POA: Diagnosis not present

## 2019-04-17 DIAGNOSIS — Z5189 Encounter for other specified aftercare: Secondary | ICD-10-CM | POA: Diagnosis not present

## 2019-05-05 DIAGNOSIS — C186 Malignant neoplasm of descending colon: Secondary | ICD-10-CM | POA: Diagnosis not present

## 2019-05-07 DIAGNOSIS — C186 Malignant neoplasm of descending colon: Secondary | ICD-10-CM | POA: Diagnosis not present

## 2019-05-20 DIAGNOSIS — C186 Malignant neoplasm of descending colon: Secondary | ICD-10-CM | POA: Diagnosis not present

## 2019-05-21 DIAGNOSIS — C186 Malignant neoplasm of descending colon: Secondary | ICD-10-CM | POA: Diagnosis not present

## 2019-05-26 DIAGNOSIS — N179 Acute kidney failure, unspecified: Secondary | ICD-10-CM | POA: Diagnosis not present

## 2019-05-26 DIAGNOSIS — K56600 Partial intestinal obstruction, unspecified as to cause: Secondary | ICD-10-CM | POA: Diagnosis not present

## 2019-05-26 DIAGNOSIS — K922 Gastrointestinal hemorrhage, unspecified: Secondary | ICD-10-CM | POA: Diagnosis not present

## 2019-05-26 DIAGNOSIS — R079 Chest pain, unspecified: Secondary | ICD-10-CM | POA: Diagnosis not present

## 2019-05-26 DIAGNOSIS — R58 Hemorrhage, not elsewhere classified: Secondary | ICD-10-CM | POA: Diagnosis not present

## 2019-05-26 DIAGNOSIS — R0902 Hypoxemia: Secondary | ICD-10-CM | POA: Diagnosis not present

## 2019-05-26 DIAGNOSIS — C189 Malignant neoplasm of colon, unspecified: Secondary | ICD-10-CM | POA: Diagnosis not present

## 2019-05-26 DIAGNOSIS — U071 COVID-19: Secondary | ICD-10-CM | POA: Diagnosis not present

## 2019-05-26 DIAGNOSIS — R1111 Vomiting without nausea: Secondary | ICD-10-CM | POA: Diagnosis not present

## 2019-05-26 DIAGNOSIS — R1030 Lower abdominal pain, unspecified: Secondary | ICD-10-CM | POA: Diagnosis not present

## 2019-05-26 DIAGNOSIS — Z03818 Encounter for observation for suspected exposure to other biological agents ruled out: Secondary | ICD-10-CM | POA: Diagnosis not present

## 2019-05-26 DIAGNOSIS — J9811 Atelectasis: Secondary | ICD-10-CM | POA: Diagnosis not present

## 2019-05-26 DIAGNOSIS — K5669 Other partial intestinal obstruction: Secondary | ICD-10-CM | POA: Diagnosis not present

## 2019-05-26 DIAGNOSIS — D62 Acute posthemorrhagic anemia: Secondary | ICD-10-CM | POA: Diagnosis not present

## 2019-05-26 DIAGNOSIS — K56609 Unspecified intestinal obstruction, unspecified as to partial versus complete obstruction: Secondary | ICD-10-CM | POA: Diagnosis not present

## 2019-05-26 DIAGNOSIS — I129 Hypertensive chronic kidney disease with stage 1 through stage 4 chronic kidney disease, or unspecified chronic kidney disease: Secondary | ICD-10-CM | POA: Diagnosis not present

## 2019-05-26 DIAGNOSIS — J9601 Acute respiratory failure with hypoxia: Secondary | ICD-10-CM | POA: Diagnosis not present

## 2019-05-26 DIAGNOSIS — J189 Pneumonia, unspecified organism: Secondary | ICD-10-CM | POA: Diagnosis not present

## 2019-05-26 DIAGNOSIS — N183 Chronic kidney disease, stage 3 unspecified: Secondary | ICD-10-CM | POA: Diagnosis not present

## 2019-05-26 DIAGNOSIS — R1084 Generalized abdominal pain: Secondary | ICD-10-CM | POA: Diagnosis not present

## 2019-05-26 DIAGNOSIS — J811 Chronic pulmonary edema: Secondary | ICD-10-CM | POA: Diagnosis not present

## 2019-05-26 DIAGNOSIS — I10 Essential (primary) hypertension: Secondary | ICD-10-CM | POA: Diagnosis not present

## 2019-06-04 DIAGNOSIS — Z79899 Other long term (current) drug therapy: Secondary | ICD-10-CM | POA: Diagnosis not present

## 2019-06-04 DIAGNOSIS — Z951 Presence of aortocoronary bypass graft: Secondary | ICD-10-CM | POA: Diagnosis not present

## 2019-06-04 DIAGNOSIS — Z48815 Encounter for surgical aftercare following surgery on the digestive system: Secondary | ICD-10-CM | POA: Diagnosis not present

## 2019-06-04 DIAGNOSIS — N189 Chronic kidney disease, unspecified: Secondary | ICD-10-CM | POA: Diagnosis not present

## 2019-06-04 DIAGNOSIS — D631 Anemia in chronic kidney disease: Secondary | ICD-10-CM | POA: Diagnosis not present

## 2019-06-04 DIAGNOSIS — I252 Old myocardial infarction: Secondary | ICD-10-CM | POA: Diagnosis not present

## 2019-06-04 DIAGNOSIS — Z7982 Long term (current) use of aspirin: Secondary | ICD-10-CM | POA: Diagnosis not present

## 2019-06-04 DIAGNOSIS — I129 Hypertensive chronic kidney disease with stage 1 through stage 4 chronic kidney disease, or unspecified chronic kidney disease: Secondary | ICD-10-CM | POA: Diagnosis not present

## 2019-06-04 DIAGNOSIS — Z933 Colostomy status: Secondary | ICD-10-CM | POA: Diagnosis not present

## 2019-06-04 DIAGNOSIS — I2510 Atherosclerotic heart disease of native coronary artery without angina pectoris: Secondary | ICD-10-CM | POA: Diagnosis not present

## 2019-06-04 DIAGNOSIS — J189 Pneumonia, unspecified organism: Secondary | ICD-10-CM | POA: Diagnosis not present

## 2019-06-09 DIAGNOSIS — J189 Pneumonia, unspecified organism: Secondary | ICD-10-CM | POA: Diagnosis not present

## 2019-06-09 DIAGNOSIS — I129 Hypertensive chronic kidney disease with stage 1 through stage 4 chronic kidney disease, or unspecified chronic kidney disease: Secondary | ICD-10-CM | POA: Diagnosis not present

## 2019-06-09 DIAGNOSIS — Z951 Presence of aortocoronary bypass graft: Secondary | ICD-10-CM | POA: Diagnosis not present

## 2019-06-09 DIAGNOSIS — D631 Anemia in chronic kidney disease: Secondary | ICD-10-CM | POA: Diagnosis not present

## 2019-06-09 DIAGNOSIS — I2510 Atherosclerotic heart disease of native coronary artery without angina pectoris: Secondary | ICD-10-CM | POA: Diagnosis not present

## 2019-06-09 DIAGNOSIS — Z933 Colostomy status: Secondary | ICD-10-CM | POA: Diagnosis not present

## 2019-06-09 DIAGNOSIS — I252 Old myocardial infarction: Secondary | ICD-10-CM | POA: Diagnosis not present

## 2019-06-09 DIAGNOSIS — Z7982 Long term (current) use of aspirin: Secondary | ICD-10-CM | POA: Diagnosis not present

## 2019-06-09 DIAGNOSIS — N189 Chronic kidney disease, unspecified: Secondary | ICD-10-CM | POA: Diagnosis not present

## 2019-06-10 DIAGNOSIS — C186 Malignant neoplasm of descending colon: Secondary | ICD-10-CM | POA: Diagnosis not present

## 2019-06-12 DIAGNOSIS — C186 Malignant neoplasm of descending colon: Secondary | ICD-10-CM | POA: Diagnosis not present

## 2019-06-13 DIAGNOSIS — Z933 Colostomy status: Secondary | ICD-10-CM | POA: Diagnosis not present

## 2019-06-13 DIAGNOSIS — I2510 Atherosclerotic heart disease of native coronary artery without angina pectoris: Secondary | ICD-10-CM | POA: Diagnosis not present

## 2019-06-13 DIAGNOSIS — J189 Pneumonia, unspecified organism: Secondary | ICD-10-CM | POA: Diagnosis not present

## 2019-06-13 DIAGNOSIS — N189 Chronic kidney disease, unspecified: Secondary | ICD-10-CM | POA: Diagnosis not present

## 2019-06-13 DIAGNOSIS — I129 Hypertensive chronic kidney disease with stage 1 through stage 4 chronic kidney disease, or unspecified chronic kidney disease: Secondary | ICD-10-CM | POA: Diagnosis not present

## 2019-06-13 DIAGNOSIS — I252 Old myocardial infarction: Secondary | ICD-10-CM | POA: Diagnosis not present

## 2019-06-13 DIAGNOSIS — Z7982 Long term (current) use of aspirin: Secondary | ICD-10-CM | POA: Diagnosis not present

## 2019-06-13 DIAGNOSIS — Z951 Presence of aortocoronary bypass graft: Secondary | ICD-10-CM | POA: Diagnosis not present

## 2019-06-13 DIAGNOSIS — D631 Anemia in chronic kidney disease: Secondary | ICD-10-CM | POA: Diagnosis not present

## 2019-06-16 DIAGNOSIS — C186 Malignant neoplasm of descending colon: Secondary | ICD-10-CM | POA: Diagnosis not present

## 2019-06-17 DIAGNOSIS — C186 Malignant neoplasm of descending colon: Secondary | ICD-10-CM | POA: Diagnosis not present

## 2019-06-18 DIAGNOSIS — Z8719 Personal history of other diseases of the digestive system: Secondary | ICD-10-CM | POA: Diagnosis not present

## 2019-06-20 DIAGNOSIS — D631 Anemia in chronic kidney disease: Secondary | ICD-10-CM | POA: Diagnosis not present

## 2019-06-20 DIAGNOSIS — I2510 Atherosclerotic heart disease of native coronary artery without angina pectoris: Secondary | ICD-10-CM | POA: Diagnosis not present

## 2019-06-20 DIAGNOSIS — N189 Chronic kidney disease, unspecified: Secondary | ICD-10-CM | POA: Diagnosis not present

## 2019-06-20 DIAGNOSIS — Z951 Presence of aortocoronary bypass graft: Secondary | ICD-10-CM | POA: Diagnosis not present

## 2019-06-20 DIAGNOSIS — I129 Hypertensive chronic kidney disease with stage 1 through stage 4 chronic kidney disease, or unspecified chronic kidney disease: Secondary | ICD-10-CM | POA: Diagnosis not present

## 2019-06-20 DIAGNOSIS — I252 Old myocardial infarction: Secondary | ICD-10-CM | POA: Diagnosis not present

## 2019-06-20 DIAGNOSIS — Z7982 Long term (current) use of aspirin: Secondary | ICD-10-CM | POA: Diagnosis not present

## 2019-06-20 DIAGNOSIS — Z933 Colostomy status: Secondary | ICD-10-CM | POA: Diagnosis not present

## 2019-06-20 DIAGNOSIS — J189 Pneumonia, unspecified organism: Secondary | ICD-10-CM | POA: Diagnosis not present

## 2019-06-23 DIAGNOSIS — Z20822 Contact with and (suspected) exposure to covid-19: Secondary | ICD-10-CM | POA: Diagnosis not present

## 2019-06-23 DIAGNOSIS — Z01812 Encounter for preprocedural laboratory examination: Secondary | ICD-10-CM | POA: Diagnosis not present

## 2019-06-23 DIAGNOSIS — Z8719 Personal history of other diseases of the digestive system: Secondary | ICD-10-CM | POA: Diagnosis not present

## 2019-06-24 DIAGNOSIS — Z933 Colostomy status: Secondary | ICD-10-CM | POA: Diagnosis not present

## 2019-06-24 DIAGNOSIS — D631 Anemia in chronic kidney disease: Secondary | ICD-10-CM | POA: Diagnosis not present

## 2019-06-24 DIAGNOSIS — I129 Hypertensive chronic kidney disease with stage 1 through stage 4 chronic kidney disease, or unspecified chronic kidney disease: Secondary | ICD-10-CM | POA: Diagnosis not present

## 2019-06-24 DIAGNOSIS — J189 Pneumonia, unspecified organism: Secondary | ICD-10-CM | POA: Diagnosis not present

## 2019-06-24 DIAGNOSIS — I2510 Atherosclerotic heart disease of native coronary artery without angina pectoris: Secondary | ICD-10-CM | POA: Diagnosis not present

## 2019-06-24 DIAGNOSIS — Z7982 Long term (current) use of aspirin: Secondary | ICD-10-CM | POA: Diagnosis not present

## 2019-06-24 DIAGNOSIS — N189 Chronic kidney disease, unspecified: Secondary | ICD-10-CM | POA: Diagnosis not present

## 2019-06-24 DIAGNOSIS — Z951 Presence of aortocoronary bypass graft: Secondary | ICD-10-CM | POA: Diagnosis not present

## 2019-06-24 DIAGNOSIS — I252 Old myocardial infarction: Secondary | ICD-10-CM | POA: Diagnosis not present

## 2019-06-27 DIAGNOSIS — N189 Chronic kidney disease, unspecified: Secondary | ICD-10-CM | POA: Diagnosis not present

## 2019-06-27 DIAGNOSIS — Z933 Colostomy status: Secondary | ICD-10-CM | POA: Diagnosis not present

## 2019-06-27 DIAGNOSIS — Z7982 Long term (current) use of aspirin: Secondary | ICD-10-CM | POA: Diagnosis not present

## 2019-06-27 DIAGNOSIS — I129 Hypertensive chronic kidney disease with stage 1 through stage 4 chronic kidney disease, or unspecified chronic kidney disease: Secondary | ICD-10-CM | POA: Diagnosis not present

## 2019-06-27 DIAGNOSIS — Z951 Presence of aortocoronary bypass graft: Secondary | ICD-10-CM | POA: Diagnosis not present

## 2019-06-27 DIAGNOSIS — D631 Anemia in chronic kidney disease: Secondary | ICD-10-CM | POA: Diagnosis not present

## 2019-06-27 DIAGNOSIS — I252 Old myocardial infarction: Secondary | ICD-10-CM | POA: Diagnosis not present

## 2019-06-27 DIAGNOSIS — I2510 Atherosclerotic heart disease of native coronary artery without angina pectoris: Secondary | ICD-10-CM | POA: Diagnosis not present

## 2019-06-27 DIAGNOSIS — J189 Pneumonia, unspecified organism: Secondary | ICD-10-CM | POA: Diagnosis not present

## 2019-06-28 DIAGNOSIS — Z951 Presence of aortocoronary bypass graft: Secondary | ICD-10-CM | POA: Diagnosis not present

## 2019-06-28 DIAGNOSIS — Z933 Colostomy status: Secondary | ICD-10-CM | POA: Diagnosis not present

## 2019-06-28 DIAGNOSIS — J189 Pneumonia, unspecified organism: Secondary | ICD-10-CM | POA: Diagnosis not present

## 2019-06-28 DIAGNOSIS — I129 Hypertensive chronic kidney disease with stage 1 through stage 4 chronic kidney disease, or unspecified chronic kidney disease: Secondary | ICD-10-CM | POA: Diagnosis not present

## 2019-06-28 DIAGNOSIS — Z7982 Long term (current) use of aspirin: Secondary | ICD-10-CM | POA: Diagnosis not present

## 2019-06-28 DIAGNOSIS — I2510 Atherosclerotic heart disease of native coronary artery without angina pectoris: Secondary | ICD-10-CM | POA: Diagnosis not present

## 2019-06-28 DIAGNOSIS — I252 Old myocardial infarction: Secondary | ICD-10-CM | POA: Diagnosis not present

## 2019-06-28 DIAGNOSIS — D631 Anemia in chronic kidney disease: Secondary | ICD-10-CM | POA: Diagnosis not present

## 2019-06-28 DIAGNOSIS — N189 Chronic kidney disease, unspecified: Secondary | ICD-10-CM | POA: Diagnosis not present

## 2019-06-30 DIAGNOSIS — Z8711 Personal history of peptic ulcer disease: Secondary | ICD-10-CM | POA: Diagnosis not present

## 2019-06-30 DIAGNOSIS — I2510 Atherosclerotic heart disease of native coronary artery without angina pectoris: Secondary | ICD-10-CM | POA: Diagnosis not present

## 2019-06-30 DIAGNOSIS — K922 Gastrointestinal hemorrhage, unspecified: Secondary | ICD-10-CM | POA: Diagnosis not present

## 2019-06-30 DIAGNOSIS — K2980 Duodenitis without bleeding: Secondary | ICD-10-CM | POA: Diagnosis not present

## 2019-06-30 DIAGNOSIS — N189 Chronic kidney disease, unspecified: Secondary | ICD-10-CM | POA: Diagnosis not present

## 2019-07-01 DIAGNOSIS — I2510 Atherosclerotic heart disease of native coronary artery without angina pectoris: Secondary | ICD-10-CM | POA: Diagnosis not present

## 2019-07-01 DIAGNOSIS — Z7982 Long term (current) use of aspirin: Secondary | ICD-10-CM | POA: Diagnosis not present

## 2019-07-01 DIAGNOSIS — N189 Chronic kidney disease, unspecified: Secondary | ICD-10-CM | POA: Diagnosis not present

## 2019-07-01 DIAGNOSIS — D631 Anemia in chronic kidney disease: Secondary | ICD-10-CM | POA: Diagnosis not present

## 2019-07-01 DIAGNOSIS — I252 Old myocardial infarction: Secondary | ICD-10-CM | POA: Diagnosis not present

## 2019-07-01 DIAGNOSIS — I129 Hypertensive chronic kidney disease with stage 1 through stage 4 chronic kidney disease, or unspecified chronic kidney disease: Secondary | ICD-10-CM | POA: Diagnosis not present

## 2019-07-01 DIAGNOSIS — Z933 Colostomy status: Secondary | ICD-10-CM | POA: Diagnosis not present

## 2019-07-01 DIAGNOSIS — J189 Pneumonia, unspecified organism: Secondary | ICD-10-CM | POA: Diagnosis not present

## 2019-07-01 DIAGNOSIS — Z951 Presence of aortocoronary bypass graft: Secondary | ICD-10-CM | POA: Diagnosis not present

## 2019-07-02 DIAGNOSIS — C186 Malignant neoplasm of descending colon: Secondary | ICD-10-CM | POA: Diagnosis not present

## 2019-07-04 DIAGNOSIS — J189 Pneumonia, unspecified organism: Secondary | ICD-10-CM | POA: Diagnosis not present

## 2019-07-04 DIAGNOSIS — I252 Old myocardial infarction: Secondary | ICD-10-CM | POA: Diagnosis not present

## 2019-07-04 DIAGNOSIS — I129 Hypertensive chronic kidney disease with stage 1 through stage 4 chronic kidney disease, or unspecified chronic kidney disease: Secondary | ICD-10-CM | POA: Diagnosis not present

## 2019-07-04 DIAGNOSIS — I2510 Atherosclerotic heart disease of native coronary artery without angina pectoris: Secondary | ICD-10-CM | POA: Diagnosis not present

## 2019-07-04 DIAGNOSIS — Z7982 Long term (current) use of aspirin: Secondary | ICD-10-CM | POA: Diagnosis not present

## 2019-07-04 DIAGNOSIS — Z933 Colostomy status: Secondary | ICD-10-CM | POA: Diagnosis not present

## 2019-07-04 DIAGNOSIS — C186 Malignant neoplasm of descending colon: Secondary | ICD-10-CM | POA: Diagnosis not present

## 2019-07-04 DIAGNOSIS — N189 Chronic kidney disease, unspecified: Secondary | ICD-10-CM | POA: Diagnosis not present

## 2019-07-04 DIAGNOSIS — Z951 Presence of aortocoronary bypass graft: Secondary | ICD-10-CM | POA: Diagnosis not present

## 2019-07-04 DIAGNOSIS — D631 Anemia in chronic kidney disease: Secondary | ICD-10-CM | POA: Diagnosis not present

## 2019-07-07 DIAGNOSIS — I129 Hypertensive chronic kidney disease with stage 1 through stage 4 chronic kidney disease, or unspecified chronic kidney disease: Secondary | ICD-10-CM | POA: Diagnosis not present

## 2019-07-07 DIAGNOSIS — N189 Chronic kidney disease, unspecified: Secondary | ICD-10-CM | POA: Diagnosis not present

## 2019-07-07 DIAGNOSIS — J189 Pneumonia, unspecified organism: Secondary | ICD-10-CM | POA: Diagnosis not present

## 2019-07-07 DIAGNOSIS — Z933 Colostomy status: Secondary | ICD-10-CM | POA: Diagnosis not present

## 2019-07-07 DIAGNOSIS — Z951 Presence of aortocoronary bypass graft: Secondary | ICD-10-CM | POA: Diagnosis not present

## 2019-07-07 DIAGNOSIS — I252 Old myocardial infarction: Secondary | ICD-10-CM | POA: Diagnosis not present

## 2019-07-07 DIAGNOSIS — D631 Anemia in chronic kidney disease: Secondary | ICD-10-CM | POA: Diagnosis not present

## 2019-07-07 DIAGNOSIS — I2510 Atherosclerotic heart disease of native coronary artery without angina pectoris: Secondary | ICD-10-CM | POA: Diagnosis not present

## 2019-07-07 DIAGNOSIS — Z7982 Long term (current) use of aspirin: Secondary | ICD-10-CM | POA: Diagnosis not present

## 2019-07-09 DIAGNOSIS — J189 Pneumonia, unspecified organism: Secondary | ICD-10-CM | POA: Diagnosis not present

## 2019-07-09 DIAGNOSIS — Z1331 Encounter for screening for depression: Secondary | ICD-10-CM | POA: Diagnosis not present

## 2019-07-09 DIAGNOSIS — I252 Old myocardial infarction: Secondary | ICD-10-CM | POA: Diagnosis not present

## 2019-07-09 DIAGNOSIS — I25119 Atherosclerotic heart disease of native coronary artery with unspecified angina pectoris: Secondary | ICD-10-CM | POA: Diagnosis not present

## 2019-07-09 DIAGNOSIS — N189 Chronic kidney disease, unspecified: Secondary | ICD-10-CM | POA: Diagnosis not present

## 2019-07-09 DIAGNOSIS — I129 Hypertensive chronic kidney disease with stage 1 through stage 4 chronic kidney disease, or unspecified chronic kidney disease: Secondary | ICD-10-CM | POA: Diagnosis not present

## 2019-07-09 DIAGNOSIS — Z933 Colostomy status: Secondary | ICD-10-CM | POA: Diagnosis not present

## 2019-07-09 DIAGNOSIS — C189 Malignant neoplasm of colon, unspecified: Secondary | ICD-10-CM | POA: Diagnosis not present

## 2019-07-09 DIAGNOSIS — D62 Acute posthemorrhagic anemia: Secondary | ICD-10-CM | POA: Diagnosis not present

## 2019-07-09 DIAGNOSIS — Z951 Presence of aortocoronary bypass graft: Secondary | ICD-10-CM | POA: Diagnosis not present

## 2019-07-09 DIAGNOSIS — N184 Chronic kidney disease, stage 4 (severe): Secondary | ICD-10-CM | POA: Diagnosis not present

## 2019-07-09 DIAGNOSIS — D631 Anemia in chronic kidney disease: Secondary | ICD-10-CM | POA: Diagnosis not present

## 2019-07-09 DIAGNOSIS — Z7982 Long term (current) use of aspirin: Secondary | ICD-10-CM | POA: Diagnosis not present

## 2019-07-09 DIAGNOSIS — I2510 Atherosclerotic heart disease of native coronary artery without angina pectoris: Secondary | ICD-10-CM | POA: Diagnosis not present

## 2019-07-09 DIAGNOSIS — Z6821 Body mass index (BMI) 21.0-21.9, adult: Secondary | ICD-10-CM | POA: Diagnosis not present

## 2019-07-15 DIAGNOSIS — C186 Malignant neoplasm of descending colon: Secondary | ICD-10-CM | POA: Diagnosis not present

## 2019-07-18 DIAGNOSIS — Z5189 Encounter for other specified aftercare: Secondary | ICD-10-CM | POA: Diagnosis not present

## 2019-07-18 DIAGNOSIS — C186 Malignant neoplasm of descending colon: Secondary | ICD-10-CM | POA: Diagnosis not present

## 2019-07-21 DIAGNOSIS — D631 Anemia in chronic kidney disease: Secondary | ICD-10-CM | POA: Diagnosis not present

## 2019-07-21 DIAGNOSIS — Z20822 Contact with and (suspected) exposure to covid-19: Secondary | ICD-10-CM | POA: Diagnosis not present

## 2019-07-21 DIAGNOSIS — Z7982 Long term (current) use of aspirin: Secondary | ICD-10-CM | POA: Diagnosis not present

## 2019-07-21 DIAGNOSIS — I252 Old myocardial infarction: Secondary | ICD-10-CM | POA: Diagnosis not present

## 2019-07-21 DIAGNOSIS — Z01812 Encounter for preprocedural laboratory examination: Secondary | ICD-10-CM | POA: Diagnosis not present

## 2019-07-21 DIAGNOSIS — Z933 Colostomy status: Secondary | ICD-10-CM | POA: Diagnosis not present

## 2019-07-21 DIAGNOSIS — Z8711 Personal history of peptic ulcer disease: Secondary | ICD-10-CM | POA: Diagnosis not present

## 2019-07-21 DIAGNOSIS — J189 Pneumonia, unspecified organism: Secondary | ICD-10-CM | POA: Diagnosis not present

## 2019-07-21 DIAGNOSIS — N189 Chronic kidney disease, unspecified: Secondary | ICD-10-CM | POA: Diagnosis not present

## 2019-07-21 DIAGNOSIS — Z951 Presence of aortocoronary bypass graft: Secondary | ICD-10-CM | POA: Diagnosis not present

## 2019-07-21 DIAGNOSIS — I2510 Atherosclerotic heart disease of native coronary artery without angina pectoris: Secondary | ICD-10-CM | POA: Diagnosis not present

## 2019-07-21 DIAGNOSIS — I129 Hypertensive chronic kidney disease with stage 1 through stage 4 chronic kidney disease, or unspecified chronic kidney disease: Secondary | ICD-10-CM | POA: Diagnosis not present

## 2019-07-22 DIAGNOSIS — R944 Abnormal results of kidney function studies: Secondary | ICD-10-CM | POA: Diagnosis not present

## 2019-07-22 DIAGNOSIS — C186 Malignant neoplasm of descending colon: Secondary | ICD-10-CM | POA: Diagnosis not present

## 2019-07-22 DIAGNOSIS — N200 Calculus of kidney: Secondary | ICD-10-CM | POA: Diagnosis not present

## 2019-07-28 DIAGNOSIS — Z951 Presence of aortocoronary bypass graft: Secondary | ICD-10-CM | POA: Diagnosis not present

## 2019-07-28 DIAGNOSIS — I129 Hypertensive chronic kidney disease with stage 1 through stage 4 chronic kidney disease, or unspecified chronic kidney disease: Secondary | ICD-10-CM | POA: Diagnosis not present

## 2019-07-28 DIAGNOSIS — D631 Anemia in chronic kidney disease: Secondary | ICD-10-CM | POA: Diagnosis not present

## 2019-07-28 DIAGNOSIS — N189 Chronic kidney disease, unspecified: Secondary | ICD-10-CM | POA: Diagnosis not present

## 2019-07-28 DIAGNOSIS — I252 Old myocardial infarction: Secondary | ICD-10-CM | POA: Diagnosis not present

## 2019-07-28 DIAGNOSIS — Z933 Colostomy status: Secondary | ICD-10-CM | POA: Diagnosis not present

## 2019-07-28 DIAGNOSIS — I2510 Atherosclerotic heart disease of native coronary artery without angina pectoris: Secondary | ICD-10-CM | POA: Diagnosis not present

## 2019-07-28 DIAGNOSIS — Z7982 Long term (current) use of aspirin: Secondary | ICD-10-CM | POA: Diagnosis not present

## 2019-07-28 DIAGNOSIS — J189 Pneumonia, unspecified organism: Secondary | ICD-10-CM | POA: Diagnosis not present

## 2019-07-29 DIAGNOSIS — Z01812 Encounter for preprocedural laboratory examination: Secondary | ICD-10-CM | POA: Diagnosis not present

## 2019-07-29 DIAGNOSIS — Z20822 Contact with and (suspected) exposure to covid-19: Secondary | ICD-10-CM | POA: Diagnosis not present

## 2019-07-29 DIAGNOSIS — Z8719 Personal history of other diseases of the digestive system: Secondary | ICD-10-CM | POA: Diagnosis not present

## 2019-08-04 DIAGNOSIS — Z933 Colostomy status: Secondary | ICD-10-CM | POA: Diagnosis not present

## 2019-08-04 DIAGNOSIS — K922 Gastrointestinal hemorrhage, unspecified: Secondary | ICD-10-CM | POA: Diagnosis not present

## 2019-08-04 DIAGNOSIS — D649 Anemia, unspecified: Secondary | ICD-10-CM | POA: Diagnosis not present

## 2019-08-04 DIAGNOSIS — Z8719 Personal history of other diseases of the digestive system: Secondary | ICD-10-CM | POA: Diagnosis not present

## 2019-08-04 DIAGNOSIS — Z85038 Personal history of other malignant neoplasm of large intestine: Secondary | ICD-10-CM | POA: Diagnosis not present

## 2019-08-04 DIAGNOSIS — K635 Polyp of colon: Secondary | ICD-10-CM | POA: Diagnosis not present

## 2019-08-04 DIAGNOSIS — D120 Benign neoplasm of cecum: Secondary | ICD-10-CM | POA: Diagnosis not present

## 2019-08-05 DIAGNOSIS — C186 Malignant neoplasm of descending colon: Secondary | ICD-10-CM | POA: Diagnosis not present

## 2019-08-06 DIAGNOSIS — C186 Malignant neoplasm of descending colon: Secondary | ICD-10-CM | POA: Diagnosis not present

## 2019-08-06 DIAGNOSIS — Z5111 Encounter for antineoplastic chemotherapy: Secondary | ICD-10-CM | POA: Diagnosis not present

## 2019-08-08 DIAGNOSIS — Z5189 Encounter for other specified aftercare: Secondary | ICD-10-CM | POA: Diagnosis not present

## 2019-08-08 DIAGNOSIS — C186 Malignant neoplasm of descending colon: Secondary | ICD-10-CM | POA: Diagnosis not present

## 2019-08-22 DIAGNOSIS — I10 Essential (primary) hypertension: Secondary | ICD-10-CM | POA: Diagnosis not present

## 2019-08-22 DIAGNOSIS — K56609 Unspecified intestinal obstruction, unspecified as to partial versus complete obstruction: Secondary | ICD-10-CM | POA: Diagnosis not present

## 2019-08-22 DIAGNOSIS — K9422 Gastrostomy infection: Secondary | ICD-10-CM | POA: Diagnosis not present

## 2019-08-22 DIAGNOSIS — K37 Unspecified appendicitis: Secondary | ICD-10-CM | POA: Diagnosis not present

## 2019-08-22 DIAGNOSIS — Z01818 Encounter for other preprocedural examination: Secondary | ICD-10-CM | POA: Diagnosis not present

## 2019-08-22 DIAGNOSIS — I2510 Atherosclerotic heart disease of native coronary artery without angina pectoris: Secondary | ICD-10-CM | POA: Diagnosis not present

## 2019-08-22 DIAGNOSIS — K9403 Colostomy malfunction: Secondary | ICD-10-CM | POA: Diagnosis not present

## 2019-08-22 DIAGNOSIS — Z933 Colostomy status: Secondary | ICD-10-CM | POA: Diagnosis not present

## 2019-08-22 DIAGNOSIS — D72829 Elevated white blood cell count, unspecified: Secondary | ICD-10-CM | POA: Diagnosis not present

## 2019-08-22 DIAGNOSIS — R109 Unspecified abdominal pain: Secondary | ICD-10-CM | POA: Diagnosis not present

## 2019-08-22 DIAGNOSIS — C179 Malignant neoplasm of small intestine, unspecified: Secondary | ICD-10-CM | POA: Diagnosis not present

## 2019-08-22 DIAGNOSIS — K561 Intussusception: Secondary | ICD-10-CM | POA: Diagnosis not present

## 2019-08-22 DIAGNOSIS — Z789 Other specified health status: Secondary | ICD-10-CM | POA: Diagnosis not present

## 2019-08-22 DIAGNOSIS — N184 Chronic kidney disease, stage 4 (severe): Secondary | ICD-10-CM | POA: Diagnosis not present

## 2019-08-22 DIAGNOSIS — R000 Tachycardia, unspecified: Secondary | ICD-10-CM | POA: Diagnosis not present

## 2019-08-22 DIAGNOSIS — C189 Malignant neoplasm of colon, unspecified: Secondary | ICD-10-CM | POA: Diagnosis not present

## 2019-08-22 DIAGNOSIS — I5020 Unspecified systolic (congestive) heart failure: Secondary | ICD-10-CM | POA: Diagnosis not present

## 2019-08-22 DIAGNOSIS — E860 Dehydration: Secondary | ICD-10-CM | POA: Diagnosis not present

## 2019-08-22 DIAGNOSIS — K651 Peritoneal abscess: Secondary | ICD-10-CM | POA: Diagnosis not present

## 2019-08-22 DIAGNOSIS — R1900 Intra-abdominal and pelvic swelling, mass and lump, unspecified site: Secondary | ICD-10-CM | POA: Diagnosis not present

## 2019-08-22 DIAGNOSIS — N179 Acute kidney failure, unspecified: Secondary | ICD-10-CM | POA: Diagnosis not present

## 2019-08-22 DIAGNOSIS — K562 Volvulus: Secondary | ICD-10-CM | POA: Diagnosis not present

## 2019-08-22 DIAGNOSIS — C771 Secondary and unspecified malignant neoplasm of intrathoracic lymph nodes: Secondary | ICD-10-CM | POA: Diagnosis not present

## 2019-08-22 DIAGNOSIS — I5030 Unspecified diastolic (congestive) heart failure: Secondary | ICD-10-CM | POA: Diagnosis not present

## 2019-08-22 DIAGNOSIS — E870 Hyperosmolality and hypernatremia: Secondary | ICD-10-CM | POA: Diagnosis not present

## 2019-08-22 DIAGNOSIS — N170 Acute kidney failure with tubular necrosis: Secondary | ICD-10-CM | POA: Diagnosis not present

## 2019-08-22 DIAGNOSIS — I130 Hypertensive heart and chronic kidney disease with heart failure and stage 1 through stage 4 chronic kidney disease, or unspecified chronic kidney disease: Secondary | ICD-10-CM | POA: Diagnosis not present

## 2019-08-22 DIAGNOSIS — K6389 Other specified diseases of intestine: Secondary | ICD-10-CM | POA: Diagnosis not present

## 2019-08-22 DIAGNOSIS — K5651 Intestinal adhesions [bands], with partial obstruction: Secondary | ICD-10-CM | POA: Diagnosis not present

## 2019-08-22 DIAGNOSIS — R079 Chest pain, unspecified: Secondary | ICD-10-CM | POA: Diagnosis not present

## 2019-09-16 DIAGNOSIS — E46 Unspecified protein-calorie malnutrition: Secondary | ICD-10-CM | POA: Diagnosis not present

## 2019-09-16 DIAGNOSIS — N189 Chronic kidney disease, unspecified: Secondary | ICD-10-CM | POA: Diagnosis not present

## 2019-09-16 DIAGNOSIS — R0602 Shortness of breath: Secondary | ICD-10-CM | POA: Diagnosis not present

## 2019-09-16 DIAGNOSIS — R7989 Other specified abnormal findings of blood chemistry: Secondary | ICD-10-CM | POA: Diagnosis not present

## 2019-09-16 DIAGNOSIS — Z20822 Contact with and (suspected) exposure to covid-19: Secondary | ICD-10-CM | POA: Diagnosis not present

## 2019-09-16 DIAGNOSIS — C801 Malignant (primary) neoplasm, unspecified: Secondary | ICD-10-CM | POA: Diagnosis not present

## 2019-09-16 DIAGNOSIS — R5381 Other malaise: Secondary | ICD-10-CM | POA: Diagnosis not present

## 2019-09-16 DIAGNOSIS — D649 Anemia, unspecified: Secondary | ICD-10-CM | POA: Diagnosis not present

## 2019-09-16 DIAGNOSIS — R05 Cough: Secondary | ICD-10-CM | POA: Diagnosis not present

## 2019-09-16 DIAGNOSIS — I130 Hypertensive heart and chronic kidney disease with heart failure and stage 1 through stage 4 chronic kidney disease, or unspecified chronic kidney disease: Secondary | ICD-10-CM | POA: Diagnosis not present

## 2019-09-16 DIAGNOSIS — I5032 Chronic diastolic (congestive) heart failure: Secondary | ICD-10-CM | POA: Diagnosis not present

## 2019-09-16 DIAGNOSIS — R293 Abnormal posture: Secondary | ICD-10-CM | POA: Diagnosis not present

## 2019-09-16 DIAGNOSIS — D72829 Elevated white blood cell count, unspecified: Secondary | ICD-10-CM | POA: Diagnosis not present

## 2019-09-16 DIAGNOSIS — J181 Lobar pneumonia, unspecified organism: Secondary | ICD-10-CM | POA: Diagnosis not present

## 2019-09-16 DIAGNOSIS — I509 Heart failure, unspecified: Secondary | ICD-10-CM | POA: Diagnosis not present

## 2019-09-16 DIAGNOSIS — N184 Chronic kidney disease, stage 4 (severe): Secondary | ICD-10-CM | POA: Diagnosis not present

## 2019-09-16 DIAGNOSIS — Z743 Need for continuous supervision: Secondary | ICD-10-CM | POA: Diagnosis not present

## 2019-09-16 DIAGNOSIS — D631 Anemia in chronic kidney disease: Secondary | ICD-10-CM | POA: Diagnosis not present

## 2019-09-16 DIAGNOSIS — R2681 Unsteadiness on feet: Secondary | ICD-10-CM | POA: Diagnosis not present

## 2019-09-16 DIAGNOSIS — I21A1 Myocardial infarction type 2: Secondary | ICD-10-CM | POA: Diagnosis not present

## 2019-09-16 DIAGNOSIS — C189 Malignant neoplasm of colon, unspecified: Secondary | ICD-10-CM | POA: Diagnosis not present

## 2019-09-16 DIAGNOSIS — R0600 Dyspnea, unspecified: Secondary | ICD-10-CM | POA: Diagnosis not present

## 2019-09-16 DIAGNOSIS — J439 Emphysema, unspecified: Secondary | ICD-10-CM | POA: Diagnosis not present

## 2019-09-16 DIAGNOSIS — M6281 Muscle weakness (generalized): Secondary | ICD-10-CM | POA: Diagnosis not present

## 2019-09-16 DIAGNOSIS — N179 Acute kidney failure, unspecified: Secondary | ICD-10-CM | POA: Diagnosis not present

## 2019-09-16 DIAGNOSIS — J189 Pneumonia, unspecified organism: Secondary | ICD-10-CM | POA: Diagnosis not present

## 2019-09-16 DIAGNOSIS — E43 Unspecified severe protein-calorie malnutrition: Secondary | ICD-10-CM | POA: Diagnosis not present

## 2019-09-16 DIAGNOSIS — E872 Acidosis: Secondary | ICD-10-CM | POA: Diagnosis not present

## 2019-09-22 DIAGNOSIS — M6281 Muscle weakness (generalized): Secondary | ICD-10-CM | POA: Diagnosis not present

## 2019-09-22 DIAGNOSIS — R05 Cough: Secondary | ICD-10-CM | POA: Diagnosis not present

## 2019-09-22 DIAGNOSIS — D631 Anemia in chronic kidney disease: Secondary | ICD-10-CM | POA: Diagnosis not present

## 2019-09-22 DIAGNOSIS — I10 Essential (primary) hypertension: Secondary | ICD-10-CM | POA: Diagnosis not present

## 2019-09-22 DIAGNOSIS — C189 Malignant neoplasm of colon, unspecified: Secondary | ICD-10-CM | POA: Diagnosis not present

## 2019-09-22 DIAGNOSIS — I509 Heart failure, unspecified: Secondary | ICD-10-CM | POA: Diagnosis not present

## 2019-09-22 DIAGNOSIS — R2681 Unsteadiness on feet: Secondary | ICD-10-CM | POA: Diagnosis not present

## 2019-09-22 DIAGNOSIS — N189 Chronic kidney disease, unspecified: Secondary | ICD-10-CM | POA: Diagnosis not present

## 2019-09-22 DIAGNOSIS — Z743 Need for continuous supervision: Secondary | ICD-10-CM | POA: Diagnosis not present

## 2019-09-22 DIAGNOSIS — D649 Anemia, unspecified: Secondary | ICD-10-CM | POA: Diagnosis not present

## 2019-09-22 DIAGNOSIS — N401 Enlarged prostate with lower urinary tract symptoms: Secondary | ICD-10-CM | POA: Diagnosis not present

## 2019-09-22 DIAGNOSIS — E785 Hyperlipidemia, unspecified: Secondary | ICD-10-CM | POA: Diagnosis not present

## 2019-09-22 DIAGNOSIS — I110 Hypertensive heart disease with heart failure: Secondary | ICD-10-CM | POA: Diagnosis not present

## 2019-09-22 DIAGNOSIS — R0602 Shortness of breath: Secondary | ICD-10-CM | POA: Diagnosis not present

## 2019-09-22 DIAGNOSIS — Z9181 History of falling: Secondary | ICD-10-CM | POA: Diagnosis not present

## 2019-09-22 DIAGNOSIS — J189 Pneumonia, unspecified organism: Secondary | ICD-10-CM | POA: Diagnosis not present

## 2019-09-22 DIAGNOSIS — K219 Gastro-esophageal reflux disease without esophagitis: Secondary | ICD-10-CM | POA: Diagnosis not present

## 2019-09-22 DIAGNOSIS — N179 Acute kidney failure, unspecified: Secondary | ICD-10-CM | POA: Diagnosis not present

## 2019-09-22 DIAGNOSIS — D72829 Elevated white blood cell count, unspecified: Secondary | ICD-10-CM | POA: Diagnosis not present

## 2019-09-22 DIAGNOSIS — R2689 Other abnormalities of gait and mobility: Secondary | ICD-10-CM | POA: Diagnosis not present

## 2019-09-22 DIAGNOSIS — R0600 Dyspnea, unspecified: Secondary | ICD-10-CM | POA: Diagnosis not present

## 2019-09-22 DIAGNOSIS — E46 Unspecified protein-calorie malnutrition: Secondary | ICD-10-CM | POA: Diagnosis not present

## 2019-09-22 DIAGNOSIS — N184 Chronic kidney disease, stage 4 (severe): Secondary | ICD-10-CM | POA: Diagnosis not present

## 2019-09-22 DIAGNOSIS — R5381 Other malaise: Secondary | ICD-10-CM | POA: Diagnosis not present

## 2019-09-22 DIAGNOSIS — R293 Abnormal posture: Secondary | ICD-10-CM | POA: Diagnosis not present

## 2019-09-22 DIAGNOSIS — Z5189 Encounter for other specified aftercare: Secondary | ICD-10-CM | POA: Diagnosis not present

## 2019-09-22 DIAGNOSIS — N400 Enlarged prostate without lower urinary tract symptoms: Secondary | ICD-10-CM | POA: Diagnosis not present

## 2019-09-24 DIAGNOSIS — E785 Hyperlipidemia, unspecified: Secondary | ICD-10-CM | POA: Diagnosis not present

## 2019-09-24 DIAGNOSIS — K219 Gastro-esophageal reflux disease without esophagitis: Secondary | ICD-10-CM | POA: Diagnosis not present

## 2019-09-24 DIAGNOSIS — I509 Heart failure, unspecified: Secondary | ICD-10-CM | POA: Diagnosis not present

## 2019-09-24 DIAGNOSIS — I110 Hypertensive heart disease with heart failure: Secondary | ICD-10-CM | POA: Diagnosis not present

## 2019-09-24 DIAGNOSIS — N400 Enlarged prostate without lower urinary tract symptoms: Secondary | ICD-10-CM | POA: Diagnosis not present

## 2019-09-26 DIAGNOSIS — M6281 Muscle weakness (generalized): Secondary | ICD-10-CM | POA: Diagnosis not present

## 2019-09-26 DIAGNOSIS — D649 Anemia, unspecified: Secondary | ICD-10-CM | POA: Diagnosis not present

## 2019-09-26 DIAGNOSIS — Z9181 History of falling: Secondary | ICD-10-CM | POA: Diagnosis not present

## 2019-09-26 DIAGNOSIS — N401 Enlarged prostate with lower urinary tract symptoms: Secondary | ICD-10-CM | POA: Diagnosis not present

## 2019-09-26 DIAGNOSIS — R2689 Other abnormalities of gait and mobility: Secondary | ICD-10-CM | POA: Diagnosis not present

## 2019-09-26 DIAGNOSIS — Z5189 Encounter for other specified aftercare: Secondary | ICD-10-CM | POA: Diagnosis not present

## 2019-09-26 DIAGNOSIS — K219 Gastro-esophageal reflux disease without esophagitis: Secondary | ICD-10-CM | POA: Diagnosis not present

## 2019-09-26 DIAGNOSIS — C189 Malignant neoplasm of colon, unspecified: Secondary | ICD-10-CM | POA: Diagnosis not present

## 2019-09-26 DIAGNOSIS — R5381 Other malaise: Secondary | ICD-10-CM | POA: Diagnosis not present

## 2019-09-26 DIAGNOSIS — I509 Heart failure, unspecified: Secondary | ICD-10-CM | POA: Diagnosis not present

## 2019-09-29 DIAGNOSIS — C189 Malignant neoplasm of colon, unspecified: Secondary | ICD-10-CM | POA: Diagnosis not present

## 2019-09-29 DIAGNOSIS — I509 Heart failure, unspecified: Secondary | ICD-10-CM | POA: Diagnosis not present

## 2019-09-29 DIAGNOSIS — Z5189 Encounter for other specified aftercare: Secondary | ICD-10-CM | POA: Diagnosis not present

## 2019-09-29 DIAGNOSIS — Z9181 History of falling: Secondary | ICD-10-CM | POA: Diagnosis not present

## 2019-09-29 DIAGNOSIS — K219 Gastro-esophageal reflux disease without esophagitis: Secondary | ICD-10-CM | POA: Diagnosis not present

## 2019-09-29 DIAGNOSIS — R2689 Other abnormalities of gait and mobility: Secondary | ICD-10-CM | POA: Diagnosis not present

## 2019-09-29 DIAGNOSIS — D649 Anemia, unspecified: Secondary | ICD-10-CM | POA: Diagnosis not present

## 2019-09-29 DIAGNOSIS — R5381 Other malaise: Secondary | ICD-10-CM | POA: Diagnosis not present

## 2019-09-29 DIAGNOSIS — E785 Hyperlipidemia, unspecified: Secondary | ICD-10-CM | POA: Diagnosis not present

## 2019-09-29 DIAGNOSIS — M6281 Muscle weakness (generalized): Secondary | ICD-10-CM | POA: Diagnosis not present

## 2019-09-29 DIAGNOSIS — N401 Enlarged prostate with lower urinary tract symptoms: Secondary | ICD-10-CM | POA: Diagnosis not present

## 2019-10-01 DIAGNOSIS — N401 Enlarged prostate with lower urinary tract symptoms: Secondary | ICD-10-CM | POA: Diagnosis not present

## 2019-10-01 DIAGNOSIS — D649 Anemia, unspecified: Secondary | ICD-10-CM | POA: Diagnosis not present

## 2019-10-01 DIAGNOSIS — E785 Hyperlipidemia, unspecified: Secondary | ICD-10-CM | POA: Diagnosis not present

## 2019-10-01 DIAGNOSIS — I509 Heart failure, unspecified: Secondary | ICD-10-CM | POA: Diagnosis not present

## 2019-10-01 DIAGNOSIS — K219 Gastro-esophageal reflux disease without esophagitis: Secondary | ICD-10-CM | POA: Diagnosis not present

## 2019-10-03 DIAGNOSIS — K219 Gastro-esophageal reflux disease without esophagitis: Secondary | ICD-10-CM | POA: Diagnosis not present

## 2019-10-03 DIAGNOSIS — R2689 Other abnormalities of gait and mobility: Secondary | ICD-10-CM | POA: Diagnosis not present

## 2019-10-03 DIAGNOSIS — I509 Heart failure, unspecified: Secondary | ICD-10-CM | POA: Diagnosis not present

## 2019-10-03 DIAGNOSIS — Z5189 Encounter for other specified aftercare: Secondary | ICD-10-CM | POA: Diagnosis not present

## 2019-10-03 DIAGNOSIS — Z9181 History of falling: Secondary | ICD-10-CM | POA: Diagnosis not present

## 2019-10-03 DIAGNOSIS — N401 Enlarged prostate with lower urinary tract symptoms: Secondary | ICD-10-CM | POA: Diagnosis not present

## 2019-10-03 DIAGNOSIS — M6281 Muscle weakness (generalized): Secondary | ICD-10-CM | POA: Diagnosis not present

## 2019-10-03 DIAGNOSIS — I10 Essential (primary) hypertension: Secondary | ICD-10-CM | POA: Diagnosis not present

## 2019-10-03 DIAGNOSIS — C189 Malignant neoplasm of colon, unspecified: Secondary | ICD-10-CM | POA: Diagnosis not present

## 2019-10-03 DIAGNOSIS — R5381 Other malaise: Secondary | ICD-10-CM | POA: Diagnosis not present

## 2019-10-03 DIAGNOSIS — D649 Anemia, unspecified: Secondary | ICD-10-CM | POA: Diagnosis not present

## 2019-10-03 DIAGNOSIS — E785 Hyperlipidemia, unspecified: Secondary | ICD-10-CM | POA: Diagnosis not present

## 2019-11-10 DIAGNOSIS — E7800 Pure hypercholesterolemia, unspecified: Secondary | ICD-10-CM | POA: Diagnosis not present

## 2019-11-10 DIAGNOSIS — I129 Hypertensive chronic kidney disease with stage 1 through stage 4 chronic kidney disease, or unspecified chronic kidney disease: Secondary | ICD-10-CM | POA: Diagnosis not present

## 2019-11-10 DIAGNOSIS — C189 Malignant neoplasm of colon, unspecified: Secondary | ICD-10-CM | POA: Diagnosis not present

## 2019-11-10 DIAGNOSIS — Z933 Colostomy status: Secondary | ICD-10-CM | POA: Diagnosis not present

## 2019-11-11 DIAGNOSIS — R970 Elevated carcinoembryonic antigen [CEA]: Secondary | ICD-10-CM | POA: Diagnosis not present

## 2019-11-11 DIAGNOSIS — C186 Malignant neoplasm of descending colon: Secondary | ICD-10-CM | POA: Diagnosis not present

## 2019-11-12 DIAGNOSIS — Z85038 Personal history of other malignant neoplasm of large intestine: Secondary | ICD-10-CM | POA: Diagnosis not present

## 2019-11-12 DIAGNOSIS — Z933 Colostomy status: Secondary | ICD-10-CM | POA: Diagnosis not present

## 2019-11-20 DIAGNOSIS — I700 Atherosclerosis of aorta: Secondary | ICD-10-CM | POA: Diagnosis not present

## 2019-11-20 DIAGNOSIS — C189 Malignant neoplasm of colon, unspecified: Secondary | ICD-10-CM | POA: Diagnosis not present

## 2019-11-20 DIAGNOSIS — C186 Malignant neoplasm of descending colon: Secondary | ICD-10-CM | POA: Diagnosis not present

## 2019-11-20 DIAGNOSIS — R1909 Other intra-abdominal and pelvic swelling, mass and lump: Secondary | ICD-10-CM | POA: Diagnosis not present

## 2019-11-20 DIAGNOSIS — M6289 Other specified disorders of muscle: Secondary | ICD-10-CM | POA: Diagnosis not present

## 2019-11-20 DIAGNOSIS — K6389 Other specified diseases of intestine: Secondary | ICD-10-CM | POA: Diagnosis not present

## 2019-11-21 DIAGNOSIS — C186 Malignant neoplasm of descending colon: Secondary | ICD-10-CM | POA: Diagnosis not present

## 2019-12-31 DIAGNOSIS — R404 Transient alteration of awareness: Secondary | ICD-10-CM | POA: Diagnosis not present

## 2019-12-31 DIAGNOSIS — R4182 Altered mental status, unspecified: Secondary | ICD-10-CM | POA: Diagnosis not present

## 2019-12-31 DIAGNOSIS — R4781 Slurred speech: Secondary | ICD-10-CM | POA: Diagnosis not present

## 2019-12-31 DIAGNOSIS — R0902 Hypoxemia: Secondary | ICD-10-CM | POA: Diagnosis not present

## 2019-12-31 DIAGNOSIS — R069 Unspecified abnormalities of breathing: Secondary | ICD-10-CM | POA: Diagnosis not present

## 2020-01-01 ENCOUNTER — Emergency Department (HOSPITAL_COMMUNITY): Payer: Medicare HMO

## 2020-01-01 ENCOUNTER — Encounter (HOSPITAL_COMMUNITY): Payer: Self-pay | Admitting: Internal Medicine

## 2020-01-01 ENCOUNTER — Inpatient Hospital Stay (HOSPITAL_COMMUNITY): Payer: Medicare HMO

## 2020-01-01 ENCOUNTER — Inpatient Hospital Stay (HOSPITAL_COMMUNITY)
Admission: EM | Admit: 2020-01-01 | Discharge: 2020-01-05 | DRG: 100 | Disposition: A | Discharge status: Home Health | Payer: Medicare HMO | Attending: Internal Medicine | Admitting: Internal Medicine

## 2020-01-01 DIAGNOSIS — R569 Unspecified convulsions: Secondary | ICD-10-CM | POA: Diagnosis not present

## 2020-01-01 DIAGNOSIS — G40901 Epilepsy, unspecified, not intractable, with status epilepticus: Secondary | ICD-10-CM | POA: Diagnosis not present

## 2020-01-01 DIAGNOSIS — R29818 Other symptoms and signs involving the nervous system: Secondary | ICD-10-CM | POA: Diagnosis not present

## 2020-01-01 DIAGNOSIS — Z6820 Body mass index (BMI) 20.0-20.9, adult: Secondary | ICD-10-CM

## 2020-01-01 DIAGNOSIS — Z8673 Personal history of transient ischemic attack (TIA), and cerebral infarction without residual deficits: Secondary | ICD-10-CM | POA: Diagnosis not present

## 2020-01-01 DIAGNOSIS — D62 Acute posthemorrhagic anemia: Secondary | ICD-10-CM | POA: Diagnosis not present

## 2020-01-01 DIAGNOSIS — E875 Hyperkalemia: Secondary | ICD-10-CM | POA: Diagnosis not present

## 2020-01-01 DIAGNOSIS — Z20822 Contact with and (suspected) exposure to covid-19: Secondary | ICD-10-CM | POA: Diagnosis present

## 2020-01-01 DIAGNOSIS — I129 Hypertensive chronic kidney disease with stage 1 through stage 4 chronic kidney disease, or unspecified chronic kidney disease: Secondary | ICD-10-CM | POA: Diagnosis present

## 2020-01-01 DIAGNOSIS — Z9221 Personal history of antineoplastic chemotherapy: Secondary | ICD-10-CM

## 2020-01-01 DIAGNOSIS — E43 Unspecified severe protein-calorie malnutrition: Secondary | ICD-10-CM | POA: Diagnosis present

## 2020-01-01 DIAGNOSIS — J3489 Other specified disorders of nose and nasal sinuses: Secondary | ICD-10-CM | POA: Diagnosis not present

## 2020-01-01 DIAGNOSIS — Z9049 Acquired absence of other specified parts of digestive tract: Secondary | ICD-10-CM

## 2020-01-01 DIAGNOSIS — Z951 Presence of aortocoronary bypass graft: Secondary | ICD-10-CM | POA: Diagnosis not present

## 2020-01-01 DIAGNOSIS — R471 Dysarthria and anarthria: Secondary | ICD-10-CM | POA: Diagnosis present

## 2020-01-01 DIAGNOSIS — I2510 Atherosclerotic heart disease of native coronary artery without angina pectoris: Secondary | ICD-10-CM | POA: Diagnosis present

## 2020-01-01 DIAGNOSIS — Z933 Colostomy status: Secondary | ICD-10-CM

## 2020-01-01 DIAGNOSIS — E785 Hyperlipidemia, unspecified: Secondary | ICD-10-CM | POA: Diagnosis present

## 2020-01-01 DIAGNOSIS — E876 Hypokalemia: Secondary | ICD-10-CM | POA: Diagnosis not present

## 2020-01-01 DIAGNOSIS — N179 Acute kidney failure, unspecified: Secondary | ICD-10-CM | POA: Diagnosis present

## 2020-01-01 DIAGNOSIS — R195 Other fecal abnormalities: Secondary | ICD-10-CM | POA: Diagnosis present

## 2020-01-01 DIAGNOSIS — L409 Psoriasis, unspecified: Secondary | ICD-10-CM | POA: Diagnosis present

## 2020-01-01 DIAGNOSIS — G9340 Encephalopathy, unspecified: Secondary | ICD-10-CM | POA: Diagnosis not present

## 2020-01-01 DIAGNOSIS — I6389 Other cerebral infarction: Secondary | ICD-10-CM | POA: Diagnosis not present

## 2020-01-01 DIAGNOSIS — J90 Pleural effusion, not elsewhere classified: Secondary | ICD-10-CM | POA: Diagnosis not present

## 2020-01-01 DIAGNOSIS — N184 Chronic kidney disease, stage 4 (severe): Secondary | ICD-10-CM | POA: Diagnosis not present

## 2020-01-01 DIAGNOSIS — R54 Age-related physical debility: Secondary | ICD-10-CM | POA: Diagnosis present

## 2020-01-01 DIAGNOSIS — R29717 NIHSS score 17: Secondary | ICD-10-CM | POA: Diagnosis present

## 2020-01-01 DIAGNOSIS — I6523 Occlusion and stenosis of bilateral carotid arteries: Secondary | ICD-10-CM | POA: Diagnosis not present

## 2020-01-01 DIAGNOSIS — L4050 Arthropathic psoriasis, unspecified: Secondary | ICD-10-CM | POA: Diagnosis present

## 2020-01-01 DIAGNOSIS — R9431 Abnormal electrocardiogram [ECG] [EKG]: Secondary | ICD-10-CM | POA: Diagnosis not present

## 2020-01-01 DIAGNOSIS — Z85038 Personal history of other malignant neoplasm of large intestine: Secondary | ICD-10-CM

## 2020-01-01 DIAGNOSIS — D649 Anemia, unspecified: Secondary | ICD-10-CM | POA: Diagnosis not present

## 2020-01-01 DIAGNOSIS — I619 Nontraumatic intracerebral hemorrhage, unspecified: Secondary | ICD-10-CM | POA: Diagnosis not present

## 2020-01-01 DIAGNOSIS — I517 Cardiomegaly: Secondary | ICD-10-CM | POA: Diagnosis not present

## 2020-01-01 DIAGNOSIS — G9389 Other specified disorders of brain: Secondary | ICD-10-CM | POA: Diagnosis not present

## 2020-01-01 DIAGNOSIS — Z79899 Other long term (current) drug therapy: Secondary | ICD-10-CM | POA: Diagnosis not present

## 2020-01-01 DIAGNOSIS — Z87891 Personal history of nicotine dependence: Secondary | ICD-10-CM | POA: Diagnosis not present

## 2020-01-01 HISTORY — DX: Malignant (primary) neoplasm, unspecified: C80.1

## 2020-01-01 HISTORY — DX: Atherosclerotic heart disease of native coronary artery without angina pectoris: I25.10

## 2020-01-01 LAB — CBG MONITORING, ED
Glucose-Capillary: 61 mg/dL — ABNORMAL LOW (ref 70–99)
Glucose-Capillary: 68 mg/dL — ABNORMAL LOW (ref 70–99)
Glucose-Capillary: 80 mg/dL (ref 70–99)
Glucose-Capillary: 84 mg/dL (ref 70–99)

## 2020-01-01 LAB — I-STAT CHEM 8, ED
BUN: 29 mg/dL — ABNORMAL HIGH (ref 8–23)
Calcium, Ion: 0.81 mmol/L — CL (ref 1.15–1.40)
Chloride: 103 mmol/L (ref 98–111)
Creatinine, Ser: 3.2 mg/dL — ABNORMAL HIGH (ref 0.61–1.24)
Glucose, Bld: 108 mg/dL — ABNORMAL HIGH (ref 70–99)
HCT: 31 % — ABNORMAL LOW (ref 39.0–52.0)
Hemoglobin: 10.5 g/dL — ABNORMAL LOW (ref 13.0–17.0)
Potassium: 3.8 mmol/L (ref 3.5–5.1)
Sodium: 140 mmol/L (ref 135–145)
TCO2: 19 mmol/L — ABNORMAL LOW (ref 22–32)

## 2020-01-01 LAB — CBC WITH DIFFERENTIAL/PLATELET
Abs Immature Granulocytes: 0.11 10*3/uL — ABNORMAL HIGH (ref 0.00–0.07)
Basophils Absolute: 0.1 10*3/uL (ref 0.0–0.1)
Basophils Relative: 1 %
Eosinophils Absolute: 0.2 10*3/uL (ref 0.0–0.5)
Eosinophils Relative: 2 %
HCT: 22.9 % — ABNORMAL LOW (ref 39.0–52.0)
Hemoglobin: 6.6 g/dL — CL (ref 13.0–17.0)
Immature Granulocytes: 1 %
Lymphocytes Relative: 20 %
Lymphs Abs: 2.2 10*3/uL (ref 0.7–4.0)
MCH: 27.7 pg (ref 26.0–34.0)
MCHC: 28.8 g/dL — ABNORMAL LOW (ref 30.0–36.0)
MCV: 96.2 fL (ref 80.0–100.0)
Monocytes Absolute: 1.1 10*3/uL — ABNORMAL HIGH (ref 0.1–1.0)
Monocytes Relative: 10 %
Neutro Abs: 7.5 10*3/uL (ref 1.7–7.7)
Neutrophils Relative %: 66 %
Platelets: 230 10*3/uL (ref 150–400)
RBC: 2.38 MIL/uL — ABNORMAL LOW (ref 4.22–5.81)
RDW: 15 % (ref 11.5–15.5)
WBC: 11.1 10*3/uL — ABNORMAL HIGH (ref 4.0–10.5)
nRBC: 0 % (ref 0.0–0.2)

## 2020-01-01 LAB — COMPREHENSIVE METABOLIC PANEL
ALT: 11 U/L (ref 0–44)
AST: 20 U/L (ref 15–41)
Albumin: 2.3 g/dL — ABNORMAL LOW (ref 3.5–5.0)
Alkaline Phosphatase: 79 U/L (ref 38–126)
Anion gap: 18 — ABNORMAL HIGH (ref 5–15)
BUN: 27 mg/dL — ABNORMAL HIGH (ref 8–23)
CO2: 17 mmol/L — ABNORMAL LOW (ref 22–32)
Calcium: 6.4 mg/dL — CL (ref 8.9–10.3)
Chloride: 105 mmol/L (ref 98–111)
Creatinine, Ser: 2.92 mg/dL — ABNORMAL HIGH (ref 0.61–1.24)
GFR calc Af Amer: 22 mL/min — ABNORMAL LOW (ref >60)
GFR calc non Af Amer: 19 mL/min — ABNORMAL LOW (ref >60)
Glucose, Bld: 110 mg/dL — ABNORMAL HIGH (ref 70–99)
Potassium: 4 mmol/L (ref 3.5–5.1)
Sodium: 140 mmol/L (ref 135–145)
Total Bilirubin: 0.4 mg/dL (ref 0.3–1.2)
Total Protein: 5.7 g/dL — ABNORMAL LOW (ref 6.5–8.1)

## 2020-01-01 LAB — URINALYSIS, ROUTINE W REFLEX MICROSCOPIC
Bilirubin Urine: NEGATIVE
Glucose, UA: NEGATIVE mg/dL
Ketones, ur: NEGATIVE mg/dL
Leukocytes,Ua: NEGATIVE
Nitrite: NEGATIVE
Protein, ur: 100 mg/dL — AB
Specific Gravity, Urine: 1.018 (ref 1.005–1.030)
pH: 7 (ref 5.0–8.0)

## 2020-01-01 LAB — APTT: aPTT: 32 seconds (ref 24–36)

## 2020-01-01 LAB — PROTIME-INR
INR: 1.1 (ref 0.8–1.2)
Prothrombin Time: 13.4 seconds (ref 11.4–15.2)

## 2020-01-01 LAB — SARS CORONAVIRUS 2 BY RT PCR (HOSPITAL ORDER, PERFORMED IN ~~LOC~~ HOSPITAL LAB): SARS Coronavirus 2: NEGATIVE

## 2020-01-01 LAB — POC OCCULT BLOOD, ED: Fecal Occult Bld: POSITIVE — AB

## 2020-01-01 LAB — PREPARE RBC (CROSSMATCH)

## 2020-01-01 LAB — ABO/RH: ABO/RH(D): AB POS

## 2020-01-01 MED ORDER — SODIUM CHLORIDE 0.9 % IV SOLN: INTRAVENOUS | Status: DC

## 2020-01-01 MED ORDER — LEVETIRACETAM ER 500 MG PO TB24
500.0000 mg | ORAL_TABLET | Freq: Every day | ORAL | Status: DC
  Administered 2020-01-01 – 2020-01-05 (×5): 500 mg via ORAL
  Filled 2020-01-01 (×6): qty 1

## 2020-01-01 MED ORDER — LORAZEPAM 2 MG/ML IJ SOLN
0.5000 mg | Freq: Once | INTRAMUSCULAR | Status: DC
  Administered 2020-01-01: 0.5 mg via INTRAVENOUS
  Filled 2020-01-01: qty 1

## 2020-01-01 MED ORDER — ACETAMINOPHEN 325 MG PO TABS
650.0000 mg | ORAL_TABLET | ORAL | Status: DC | PRN
  Administered 2020-01-03 – 2020-01-04 (×4): 650 mg via ORAL
  Filled 2020-01-01 (×5): qty 2

## 2020-01-01 MED ORDER — LEVETIRACETAM IN NACL 1000 MG/100ML IV SOLN
1000.0000 mg | INTRAVENOUS | Status: AC
  Administered 2020-01-01 (×2): 1000 mg via INTRAVENOUS

## 2020-01-01 MED ORDER — ACETAMINOPHEN 160 MG/5ML PO SOLN: 650.0000 mg | ORAL | Status: DC | PRN

## 2020-01-01 MED ORDER — DEXTROSE-NACL 5-0.45 % IV SOLN: INTRAVENOUS | Status: AC

## 2020-01-01 MED ORDER — HYDRALAZINE HCL 20 MG/ML IJ SOLN: 10.0000 mg | INTRAMUSCULAR | Status: DC | PRN

## 2020-01-01 MED ORDER — SODIUM CHLORIDE 0.9 % IV SOLN
2000.0000 mg | INTRAVENOUS | Status: DC
  Filled 2020-01-01: qty 20

## 2020-01-01 MED ORDER — SODIUM CHLORIDE 0.9% IV SOLUTION: Freq: Once | INTRAVENOUS | Status: DC

## 2020-01-01 MED ORDER — CALCIUM GLUCONATE-NACL 1-0.675 GM/50ML-% IV SOLN
1.0000 g | Freq: Once | INTRAVENOUS | Status: AC
  Administered 2020-01-01: 1000 mg via INTRAVENOUS
  Filled 2020-01-01: qty 50

## 2020-01-01 MED ORDER — ACETAMINOPHEN 650 MG RE SUPP: 650.0000 mg | RECTAL | Status: DC | PRN

## 2020-01-01 MED ORDER — CHLORHEXIDINE GLUCONATE CLOTH 2 % EX PADS
6.0000 | MEDICATED_PAD | Freq: Every day | CUTANEOUS | Status: DC
  Administered 2020-01-02 – 2020-01-04 (×3): 6 via TOPICAL

## 2020-01-01 MED ORDER — SODIUM CHLORIDE 0.9% FLUSH
3.0000 mL | Freq: Once | INTRAVENOUS | Status: DC
Start: 2020-01-01 — End: 2020-01-01

## 2020-01-01 MED ORDER — IOHEXOL 350 MG/ML SOLN
75.0000 mL | Freq: Once | INTRAVENOUS | Status: AC | PRN
  Administered 2020-01-01: 75 mL via INTRAVENOUS

## 2020-01-01 NOTE — Consult Note (Addendum)
NEUROLOGY CONSULTATION NOTE   Date of service: January 01, 2020 Patient Name: Reginald Todd MRN:  [id number] DOB:  Dec 08, 1933 Reason for consult: "stroke code"  History of Present Illness  Reginald Todd is a 86 y.o. male with PMH significant for Colon cancer s/p hemi colectomy, CAD, HTN, HLD, MI who presents as a stroke code for AMS and R gaze deviation.  Patient was at his baseline per daughter on the phone at 2200 on 12/31/19. He was found at 23:30 unresponsive and looking to his Right. Enroute, EMS noted him to have some flexion stiffening of his BL upper extremities and he was given 5mg  of Versed. In the ED, he was noted to be quite encephalopathic with R gaze preference but moving all extremities spontaneously.  CT head without contrast with an old left occipital lobe infarct but no acute intracranial abnormality.  CT angio head and neck with no large vessel occlusion.  TPA was not offered given my suspicion was highest for this being seizure rather than stroke.  He was noted to be moving all of his extremities with right gaze preference. Which improved after Keppra load.  NIHSS of 17 for encephalopathy, unable to answer questions or follow commands, spontaneously moves all extremities with some antigravity movement, aphasia due to encephalopathy. mRS: Appears to have a baseline mRS of about 2 on discussion with daughter over the phone. LKW: 2200 on 12/31/19.   ROS   Unable to obtain 2/2 encephalopathy.  Past History  No past medical history on file. The histories are not reviewed yet. Please review them in the "History" navigator section and refresh this [place]. No family history on file. Social History   Socioeconomic History  . Marital status: Married    Spouse name: Not on file  . Number of children: Not on file  . Years of education: Not on file  . Highest education level: Not on file  Occupational History  . Not on file  Tobacco Use  . Smoking status: Not on file   Substance and Sexual Activity  . Alcohol use: Not on file  . Drug use: Not on file  . Sexual activity: Not on file  Other Topics Concern  . Not on file  Social History Narrative  . Not on file   Social Determinants of Health   Financial Resource Strain:   . Difficulty of Paying Living Expenses: Not on file  Food Insecurity:   . Worried About Charity fundraiser in the Last Year: Not on file  . Ran Out of Food in the Last Year: Not on file  Transportation Needs:   . Lack of Transportation (Medical): Not on file  . Lack of Transportation (Non-Medical): Not on file  Physical Activity:   . Days of Exercise per Week: Not on file  . Minutes of Exercise per Session: Not on file  Stress:   . Feeling of Stress : Not on file  Social Connections:   . Frequency of Communication with Friends and Family: Not on file  . Frequency of Social Gatherings with Friends and Family: Not on file  . Attends Religious Services: Not on file  . Active Member of Clubs or Organizations: Not on file  . Attends Archivist Meetings: Not on file  . Marital Status: Not on file   Not on File  Medications  (Not in a hospital admission)    Vitals     There is no height or weight on file to calculate BMI.  Physical Exam   General: Laying comfortably in bed; in no acute distress.  HENT: Normal oropharynx and mucosa. Normal external appearance of ears and nose. Neck: Supple, no pain or tenderness CV: No JVD. No peripheral edema. Pulmonary: Symmetric Chest rise. Normal respiratory effort. Abdomen: Soft to touch, non-tender Ext: No cyanosis, edema, or deformity  Skin: No rash. Normal palpation of skin.   Musculoskeletal: Normal digits and nails by inspection. No clubbing.  Neurologic Examination  Mental status/Cognition: somnolent, noted to briefly track face. Speech/language: No speech due to encephalopathy. Cranial nerves:   CN II Pupils equal and reactive to light   CN III,IV,VI  right  gaze preference, does cross midline, no nystagmus.   CN V    CN VII  symmetric facial grimace, no nasolabial fold flattening   CN VIII    CN IX & X    CN XI    CN XII midline tongue protrusion   Motor:  Muscle bulk: poor, tone normal  Moves all extremities spontaneously and at least antigravity.  Reflexes:  Right Left Comments  Pectoralis      Biceps (C5/6) 2 2   Brachioradialis (C5/6) 2 2    Triceps (C6/7) 2 2    Patellar (L3/4) 2 2    Achilles (S1) 2 2    Hoffman      Plantar     Jaw jerk    Sensation:  Light touch Grimaces to noxious stimuli in all extremities.   Pin prick    Temperature    Vibration   Proprioception    Coordination/Complex Motor:  Unable to assess due to encephalopathy.  Labs   Lab Results  Component Value Date   NA 140 01/01/2020   K 4.0 01/01/2020   CL 105 01/01/2020   CO2 17 (L) 01/01/2020   GLUCOSE 110 (H) 01/01/2020   BUN 27 (H) 01/01/2020   CREATININE 2.92 (H) 01/01/2020   CALCIUM 6.4 (LL) 01/01/2020   ALBUMIN 2.3 (L) 01/01/2020   AST 20 01/01/2020   ALT 11 01/01/2020   ALKPHOS 79 01/01/2020   BILITOT 0.4 01/01/2020   GFRNONAA 19 (L) 01/01/2020   GFRAA 22 (L) 01/01/2020     Imaging and Diagnostic studies  CT head without contrast: 1. No acute intracranial abnormality. 2. ASPECTS is 10. 3. 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  CT angio head and neck: No large vessel occlusion.  Impression   Reginald Todd is a 86 y.o. male with PMH significant for Colon cancer s/p hemi colectomy, CAD, HTN, HLD, MI who presents as a stroke code for AMS and R gaze deviation.  He had an episode of stiffening in the ambulance and was given Versed 5 mg.  Neurological exam with encephalopathy, with some right gaze preference which improved after getting Keppra.  He was noted to be spontaneously moving all extremities.  His presentation was more concerning for a seizure rather than a stroke.  We therefore, decided to  hold off on tPA.  CT head without contrast demonstrates an old left occipital infarct and CT angio head and neck with no large vessel occlusion.  Recommendations  -Keppra 2 g IV once. -I changed the initial order from cEEG to rEEG after he started waking up more. -Recommend MRI brain without contrast after the continuous EEG. -Recommend evaluation for infectious/toxic metabolic causes for encephalopathy and correction of any electrolytic abnormalities. -Further antiepileptic medications based on EEG. ______________________________________________________________________   Thank you for the opportunity to take part in  the care of this patient. If you have any further questions, please contact the neurology consultation attending.  Signed,  [place] Pager Number [phone]

## 2020-01-01 NOTE — ED Notes (Signed)
Had trouble getting a good read on pulse-ox

## 2020-01-01 NOTE — ED Notes (Signed)
EEG being conducted

## 2020-01-01 NOTE — ED Notes (Signed)
Off floor to MRI

## 2020-01-01 NOTE — ED Notes (Signed)
Pt remains in MRI 

## 2020-01-01 NOTE — ED Notes (Signed)
Notified brittany-RN of CBG 61

## 2020-01-01 NOTE — ED Triage Notes (Signed)
Pt brought from home, lwk 2200, found by daughter at 2330, not responding/answering questions, right sided gaze. EMS reported decorticate posturing- gave 5mg  versed en route. Pt not answering questions in ED

## 2020-01-01 NOTE — H&P (Addendum)
History and Physical    Reginald Todd WFU:[id number] DOB: 02-19-1934 DOA: 01/01/2020  PCP: Rochel Brome, MD  Patient coming from: Home.  History obtained from patient's daughter Ms. Reginald Todd.  Patient is confused.  Chief Complaint: Confused state.  HPI: Reginald Todd is a 86 y.o. male with history of colon cancer status post hemicolectomy with colostomy last chemotherapy in June 2021 at [facility] with history of CAD status post CABG, hypertension, psoriasis on methotrexate was found to be slightly confused with right-sided gaze preference by patient's wife at around 10:00 last night.  Patient's daughter talked to the patient around 8 PM when patient was appearing normal.  Patient did not have any fever chills or any change in medications recently.  Per daughter patient is to drink a lot of alcohol prior to the colon cancer diagnosis and recently last 2 years has been drinking once a week.  ED Course: In the ER patient was evaluated by the neurologist.  CT head and CT angiogram of the head and neck did not show any large vessel obstruction or any acute findings.  Concern for status epilepticus patient was loaded with 2 g IV Keppra and continuous EEG was placed.  At the time of my exam patient is more alert but still confused repeating my questions.  Labs are significant for corrected calcium was 7.4 hemoglobin of 6.6 with colostomy bag showing liquid stools which are brown in color but stool for occult blood positive.  Creatinine is 2.9 the last 1 available in care everywhere instead in 2017 was around 1.5 with GFR of around 40%.  Bicarb is 17 now with albumin of 2.3.  WBC 11.1 chest x-ray is pending UA is unremarkable.  Patient admitted for acute encephalopathy likely postictal with anemia with blood loss and mild hypocalcemia.  Review of Systems: As per HPI, rest all negative.   Past Medical History:  Diagnosis Date  . Cancer ([place])   . Coronary artery disease     Past Surgical History:   Procedure Laterality Date  . COLON SURGERY    . CORONARY ARTERY BYPASS GRAFT       reports that he has quit smoking. He has never used smokeless tobacco. He reports current alcohol use. He reports that he does not use drugs.  Not on File  Family History  Family history unknown: Yes    Prior to Admission medications   Not on File    Physical Exam: Constitutional: Moderately built and nourished. Vitals:   01/01/20 0359 01/01/20 0503  BP:  (!) 161/94  Pulse: 86 91  Resp: (!) 29 (!) 27  Temp:  98.3 F (36.8 C)  TempSrc:  Rectal  SpO2: 100% 96%   Eyes: Anicteric no pallor. ENMT: No discharge from the ears eyes nose or mouth. Neck: No mass felt.  No neck rigidity. Respiratory: No rhonchi or crepitations. Cardiovascular: S1-S2 heard. Abdomen: Soft nontender colostomy bag seen. Musculoskeletal: No edema. Skin: No obvious rash at this time. Neurologic: Patient is alert and awake but confused does not follow commands moving all extremities.  Pupils are reacting to light. Psychiatric: Confused.   Labs on Admission: I have personally reviewed following labs and imaging studies  CBC: Recent Labs  Lab 01/01/20 0107 01/01/20 0116  WBC  --  11.1*  NEUTROABS  --  7.5  HGB 10.5* 6.6*  HCT 31.0* 22.9*  MCV  --  96.2  PLT  --  202   Basic Metabolic Panel: Recent Labs  Lab 01/01/20  0107 01/01/20 0116  NA 140 140  K 3.8 4.0  CL 103 105  CO2  --  17*  GLUCOSE 108* 110*  BUN 29* 27*  CREATININE 3.20* 2.92*  CALCIUM  --  6.4*   GFR: CrCl cannot be calculated (Unknown ideal weight.). Liver Function Tests: Recent Labs  Lab 01/01/20 0116  AST 20  ALT 11  ALKPHOS 79  BILITOT 0.4  PROT 5.7*  ALBUMIN 2.3*   No results for input(s): LIPASE, AMYLASE in the last 168 hours. No results for input(s): AMMONIA in the last 168 hours. Coagulation Profile: Recent Labs  Lab 01/01/20 0149  INR 1.1   Cardiac Enzymes: No results for input(s): CKTOTAL, CKMB, CKMBINDEX,  TROPONINI in the last 168 hours. BNP (last 3 results) No results for input(s): PROBNP in the last 8760 hours. HbA1C: No results for input(s): HGBA1C in the last 72 hours. CBG: Recent Labs  Lab 01/01/20 0034  GLUCAP 84   Lipid Profile: No results for input(s): CHOL, HDL, LDLCALC, TRIG, CHOLHDL, LDLDIRECT in the last 72 hours. Thyroid Function Tests: No results for input(s): TSH, T4TOTAL, FREET4, T3FREE, THYROIDAB in the last 72 hours. Anemia Panel: No results for input(s): VITAMINB12, FOLATE, FERRITIN, TIBC, IRON, RETICCTPCT in the last 72 hours. Urine analysis:    Component Value Date/Time   COLORURINE STRAW (A) 01/01/2020 0501   APPEARANCEUR CLEAR 01/01/2020 0501   LABSPEC 1.018 01/01/2020 0501   PHURINE 7.0 01/01/2020 0501   GLUCOSEU NEGATIVE 01/01/2020 0501   HGBUR SMALL (A) 01/01/2020 0501   BILIRUBINUR NEGATIVE 01/01/2020 0501   KETONESUR NEGATIVE 01/01/2020 0501   PROTEINUR 100 (A) 01/01/2020 0501   NITRITE NEGATIVE 01/01/2020 0501   LEUKOCYTESUR NEGATIVE 01/01/2020 0501   Sepsis Labs: @LABRCNTIP (procalcitonin:4,lacticidven:4) )No results found for this or any previous visit (from the past 240 hour(s)).   Radiological Exams on Admission: CT HEAD CODE STROKE WO CONTRAST  Result Date: 01/01/2020 CLINICAL DATA:  Code stroke.  Fixed rightward gaze. EXAM: CT HEAD WITHOUT CONTRAST TECHNIQUE: Contiguous axial images were obtained from the base of the skull through the vertex without intravenous contrast. COMPARISON:  None. FINDINGS: Brain: There is no mass, hemorrhage or extra-axial collection. There is generalized atrophy without lobar predilection. There is hypoattenuation of the periventricular white matter, most commonly indicating chronic ischemic microangiopathy. There is an old left occipital lobe infarct. Vascular: No abnormal hyperdensity of the major intracranial arteries or dural venous sinuses. No intracranial atherosclerosis. Skull: The visualized skull base,  calvarium and extracranial soft tissues are normal. Sinuses/Orbits: No fluid levels or advanced mucosal thickening of the visualized paranasal sinuses. No mastoid or middle ear effusion. The orbits are normal. ASPECTS [facility] Stroke Program Early CT Score) - Ganglionic level infarction (caudate, lentiform nuclei, internal capsule, insula, M1-M3 cortex): 7 - Supraganglionic infarction (M4-M6 cortex): 3 Total score (0-10 with 10 being normal): 10 IMPRESSION: 1. No acute intracranial abnormality. 2. ASPECTS is 10. 3. 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 These results were communicated to Dr. Donnetta Simpers at 12:58 am on 01/01/2020 by text page via the [facility] messaging system. Electronically Signed   By: Ulyses Jarred M.D.   On: 01/01/2020 01:00   CT ANGIO HEAD CODE STROKE  Result Date: 01/01/2020 CLINICAL DATA:  Fixed rightward gaze EXAM: CT ANGIOGRAPHY HEAD AND NECK TECHNIQUE: Multidetector CT imaging of the head and neck was performed using the standard protocol during bolus administration of intravenous contrast. Multiplanar CT image reconstructions and MIPs were obtained to evaluate the vascular anatomy.  Carotid stenosis measurements (when applicable) are obtained utilizing NASCET criteria, using the distal internal carotid diameter as the denominator. CONTRAST:  33mL OMNIPAQUE IOHEXOL 350 MG/ML SOLN COMPARISON:  None. FINDINGS: CTA NECK FINDINGS SKELETON: There is no bony spinal canal stenosis. No lytic or blastic lesion. OTHER NECK: Normal pharynx, larynx and major salivary glands. No cervical lymphadenopathy. Unremarkable thyroid gland. UPPER CHEST: No pneumothorax or pleural effusion. No nodules or masses. AORTIC ARCH: There is no calcific atherosclerosis of the aortic arch. There is no aneurysm, dissection or hemodynamically significant stenosis of the visualized portion of the aorta. Conventional 3 vessel aortic branching pattern. The visualized proximal subclavian  arteries are widely patent. RIGHT CAROTID SYSTEM: Normal without aneurysm, dissection or stenosis. LEFT CAROTID SYSTEM: Normal without aneurysm, dissection or stenosis. VERTEBRAL ARTERIES: Left dominant configuration. Both origins are clearly patent. There is no dissection, occlusion or flow-limiting stenosis to the skull base (V1-V3 segments). CTA HEAD FINDINGS POSTERIOR CIRCULATION: --Vertebral arteries: Normal V4 segments. --Inferior cerebellar arteries: Normal. --Basilar artery: Normal. --Superior cerebellar arteries: Normal. --Posterior cerebral arteries (PCA): Normal. ANTERIOR CIRCULATION: --Intracranial internal carotid arteries: Atherosclerotic calcification of the internal carotid arteries at the skull base without hemodynamically significant stenosis. --Anterior cerebral arteries (ACA): Normal. Both A1 segments are present. Patent anterior communicating artery (a-comm). --Middle cerebral arteries (MCA): Normal. VENOUS SINUSES: As permitted by contrast timing, patent. ANATOMIC VARIANTS: None Review of the MIP images confirms the above findings. IMPRESSION: Normal CTA of the head and neck. Electronically Signed   By: Ulyses Jarred M.D.   On: 01/01/2020 03:24   CT ANGIO NECK CODE STROKE  Result Date: 01/01/2020 CLINICAL DATA:  Fixed rightward gaze EXAM: CT ANGIOGRAPHY HEAD AND NECK TECHNIQUE: Multidetector CT imaging of the head and neck was performed using the standard protocol during bolus administration of intravenous contrast. Multiplanar CT image reconstructions and MIPs were obtained to evaluate the vascular anatomy. Carotid stenosis measurements (when applicable) are obtained utilizing NASCET criteria, using the distal internal carotid diameter as the denominator. CONTRAST:  66mL OMNIPAQUE IOHEXOL 350 MG/ML SOLN COMPARISON:  None. FINDINGS: CTA NECK FINDINGS SKELETON: There is no bony spinal canal stenosis. No lytic or blastic lesion. OTHER NECK: Normal pharynx, larynx and major salivary glands. No  cervical lymphadenopathy. Unremarkable thyroid gland. UPPER CHEST: No pneumothorax or pleural effusion. No nodules or masses. AORTIC ARCH: There is no calcific atherosclerosis of the aortic arch. There is no aneurysm, dissection or hemodynamically significant stenosis of the visualized portion of the aorta. Conventional 3 vessel aortic branching pattern. The visualized proximal subclavian arteries are widely patent. RIGHT CAROTID SYSTEM: Normal without aneurysm, dissection or stenosis. LEFT CAROTID SYSTEM: Normal without aneurysm, dissection or stenosis. VERTEBRAL ARTERIES: Left dominant configuration. Both origins are clearly patent. There is no dissection, occlusion or flow-limiting stenosis to the skull base (V1-V3 segments). CTA HEAD FINDINGS POSTERIOR CIRCULATION: --Vertebral arteries: Normal V4 segments. --Inferior cerebellar arteries: Normal. --Basilar artery: Normal. --Superior cerebellar arteries: Normal. --Posterior cerebral arteries (PCA): Normal. ANTERIOR CIRCULATION: --Intracranial internal carotid arteries: Atherosclerotic calcification of the internal carotid arteries at the skull base without hemodynamically significant stenosis. --Anterior cerebral arteries (ACA): Normal. Both A1 segments are present. Patent anterior communicating artery (a-comm). --Middle cerebral arteries (MCA): Normal. VENOUS SINUSES: As permitted by contrast timing, patent. ANATOMIC VARIANTS: None Review of the MIP images confirms the above findings. IMPRESSION: Normal CTA of the head and neck. Electronically Signed   By: Ulyses Jarred M.D.   On: 01/01/2020 03:24    EKG: Independently reviewed.  Normal sinus rhythm.  Assessment/Plan Principal Problem:   Acute encephalopathy Active Problems:   Acute blood loss anemia   AKI (acute kidney injury) ([place])   CAD (coronary artery disease)   Psoriasis   Post-ictal state ([place])    1. Acute encephalopathy likely postictal -discussed with on-call neurologist.  Patient was  given loading dose of Keppra 2 g IV.  Plan is to get EEG and MRI brain.  Seizure precautions for now.  Further recommendations per neurologist. 2. Acute blood loss anemia with history of colon cancer last chemotherapy in June 2021 as per the patient's daughter receives chemo at [facility].  Stool for occult blood is positive will need GI input for now I have got consent from patient's daughter for transfusing 1 unit of PRBC transfusion.  Patient is presently n.p.o. since patient is encephalopathic.  Consult GI in the morning.  Follow CBC after transfusion. 3. CAD status post CABG -Home medication needs to be verified.  Presently n.p.o.  EKG does not show anything acute. 4. History of hypertension we will keep patient on as needed IV hydralazine for now. 5. History of psoriasis on methotrexate likely contributing to patient's anemia also. 6. Acute renal failure with mild hypocalcemia -gently hydrate and correct calcium.  Hypocalcemia could be contributing to the seizures.  Recent baseline creatinine not known the only 1 available in our system is in 2017 in [place]. 7. Severe protein calorie malnutrition will need nutrition input once patient is more stable.  Since patient has possible status epilepticus from which patient is improving now but will need further close monitoring in addition patient also has blood loss anemia will need further work-up and inpatient status.  Chest x-ray and Covid test is pending.   DVT prophylaxis: SCDs.  Since patient has a GI bleed will avoid anticoagulation. Code Status: Full code confirmed with patient's daughter. Family Communication: Patient's daughter. Disposition Plan: Home. Consults called: Neurology. Admission status: Inpatient.   Rise Patience MD [facility] Pager [phone].  If 7PM-7AM, please contact night-coverage [email or web address] Password [facility]  01/01/2020, 5:23 AM

## 2020-01-01 NOTE — Progress Notes (Signed)
NEUROLOGY Progress Note   Date of service: January 01, 2020 Patient Name: Reginald Todd MRN:  [id number] DOB:  [birth year] Reason for consult: "stroke code"  History of Present Illness  Reginald Todd is a 86 y.o. male with general debility, mRS 2-3 with PMH significant for Colon cancer s/p hemi colectomy, CAD, HTN, HLD, MI who presents as a stroke code for AMS and R gaze deviation. Enroute, EMS noted him to have some flexion stiffening of his BL upper extremities and he was given 5mg  of Versed. In the ED, he was noted to be quite encephalopathic with R gaze preference but moving all extremities spontaneously. CT head without contrast with an old left occipital lobe infarct but no acute intracranial abnormality.  CT angio head and neck with no large vessel occlusion. MRI neg for acute changes.ddx seizures.  He has improved after Keppra load. EEG done after Keppra showed slowing, no seizures or definite discharges.    ROS  Unable d/t encephalopathy, confusion  Vitals  Temp:  [98.3 F (36.8 C)-98.5 F (36.9 C)] 98.5 F (36.9 C) (08/19 0930) Pulse Rate:  [65-149] 65 (08/19 0930) Resp:  [14-29] 24 (08/19 1045) BP: (144-170)/(75-132) 146/85 (08/19 1045) SpO2:  [95 %-100 %] 98 % (08/19 0930) Weight:  [61.3 kg] 61.3 kg (08/19 0529)  Body mass index is 19.39 kg/m.  Physical Exam   General: Laying comfortably in bed; in no acute distress. Appears frail, quite ill looking HENT: Normal oropharynx and mucosa. Normal external appearance of ears and nose. Neck: Supple, no pain or tenderness CV: No JVD. No peripheral edema. Pulmonary: Symmetric Chest rise. Normal respiratory effort. Abdomen: Soft to touch, non-tender, ostomy bag inplace Ext: No cyanosis, edema, or deformity  Skin: No rash. Normal palpation of skin.   Musculoskeletal: Normal digits and nails by inspection. No clubbing.  Neurologic Examination  Mental status/Cognition: awake, able to make out his name, otherwise he is speaking  jibberish. Tracks, did follow simple commands for motor exam. Severe dysarthria, unable to name or understand further speech for true aphasia/congnition testing.  Cranial nerves:   CN II Pupils equal and reactive to light   CN III,IV,VI  Gaze midline, able to follow EOMI, no nystagmus.   CN V    CN VII  symmetric facial grimace, no nasolabial fold flattening   CN VIII    CN IX & X    CN XI    CN XII midline tongue protrusion   Motor:  Muscle bulk: poor, tone normal  Moves all extremities spontaneously and at least antigravity. Diffusely weak, no focal deficits.  Reflexes:  Right Left Comments  Pectoralis      Biceps (C5/6) 2 2   Brachioradialis (C5/6) 2 2    Triceps (C6/7) 2 2    Patellar (L3/4) 2 2    Achilles (S1) 2 2    Hoffman      Plantar     Jaw jerk    Sensation:  Light touch Grimaces to noxious stimuli in all extremities.   Pin prick    Temperature    Vibration   Proprioception    Coordination/Complex Motor:  Unable to assess due to encephalopathy and unable to follow complex commands.  Labs   Lab Results  Component Value Date   NA 140 01/01/2020   K 4.0 01/01/2020   CL 105 01/01/2020   CO2 17 (L) 01/01/2020   GLUCOSE 110 (H) 01/01/2020   BUN 27 (H) 01/01/2020   CREATININE 2.92 (H) 01/01/2020  CALCIUM 6.4 (LL) 01/01/2020   ALBUMIN 2.3 (L) 01/01/2020   AST 20 01/01/2020   ALT 11 01/01/2020   ALKPHOS 79 01/01/2020   BILITOT 0.4 01/01/2020   GFRNONAA 19 (L) 01/01/2020   GFRAA 22 (L) 01/01/2020     Imaging and Diagnostic studies  CT head without contrast: 1. No acute intracranial abnormality. 2. ASPECTS is 10. 3. 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  CT angio head and neck: No large vessel occlusion.  MRI brain: 1. No acute intracranial abnormality. 2. 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 3. Moderate for age signal changes in the cerebral white matter, basal ganglia, and  pons, most commonly due to chronic small vessel disease.  Impression   Reginald Todd is a 86 y.o. male with PMH significant for Colon cancer s/p hemi colectomy, CAD, HTN, HLD, MI who presents as a stroke code for AMS and R gaze deviation.  He had an episode of stiffening in the ambulance and was given Versed 5 mg.  Neurological exam slowly improving encephalopathy, gaze improved after after getting Keppra.  He was noted to be spontaneously moving all extremities.  His presentation was more concerning for a seizure rather than a stroke.  1. Seizures- Despite no seizures seen today on EEG, this donesn't r/o seizure. Con't keppra. 2. Encephalopathy- multifactoral d/t seizures, postictal stat, acute anemia, acute illness, toxic/metabolic. Slowly improving 3. Acute blood loss anemia with colon cancer. Getting transfusion. Follow h/h. 4. HTN- normotensive goals 5. AKI- hydrate and follow labs  Recommendations  -Seizure precautions - Keppra 500mg  PO BID -Supoprtive care -Correct all underlying derangments, anemia etc -Recommend further wk up for infectious/toxic metabolic and anemic causes for encephalopathy and correction of any electrolytic abnormalities.  _Desiree Metzger-Cihelka, ARNP-C, ANVP-BC Pager: [phone]

## 2020-01-01 NOTE — Progress Notes (Signed)
[facility] PROGRESS NOTE    Progress Note  Reginald Todd  WRU:[id number] DOB: April 03, 1934 DOA: 01/01/2020 PCP: Rochel Brome, MD     Brief Narrative:   Reginald Todd is an 86 y.o. male past medical history of colon cancer status post hemicolectomy with colostomy and chemotherapy in June 2021 at [facility] with a history of CAD status post CABG psoriasis on methotrexate was found slightly confused with right-sided gaze around 10 PM the day prior to admission, CT angio of the head and neck did not show any large vessel obstruction, there was a concern for seizures as she was loaded with Keppra and continuous EEG was placed.  Assessment/Plan:   Acute encephalopathy EEG and MRI have been ordered.  She was loaded with Keppra and will continue this IV. Continue seizure precautions neurology has been consulted.  Acute blood loss anemia with a history of colon cancer: with last chemotherapy in June 2021, FOBT positive. She is getting 1 unit of packed red blood cells.  Check a CBC posttransfusion on. She is currently n.p.o.  CAD status post CABG: Continue current home medications once he was able to take p.o.'s.  Essential hypertension: Continue hydralazine IV as needed for now.  Psoriatic arthritis: On methotrexate, cont.  AKI: Unclear baseline, unknown her medication list. We will consult pharmacy for med rec.  At this point in time we will continue gentle hydration.  Post-ictal state [facility]) Patient is still post ictal we will continue to monitor.    DVT prophylaxis: SCD Family Communication:none Status is: Inpatient  Remains inpatient appropriate because:Hemodynamically unstable   Dispo: The patient is from: Home              Anticipated d/c is to: SNF              Anticipated d/c date is: > 3 days              Patient currently is not medically stable to d/c.        Code Status:     Code Status Orders  (From admission, onward)         Start     Ordered    01/01/20 0521  Full code  Continuous        01/01/20 0522        Code Status History    This patient has a current code status but no historical code status.   Advance Care Planning Activity        IV Access:    Peripheral IV   Procedures and diagnostic studies:   DG CHEST PORT 1 VIEW  Result Date: 01/01/2020 CLINICAL DATA:  Seizure. EXAM: PORTABLE CHEST 1 VIEW COMPARISON:  Chest x-ray 05/31/2019, 03/15/2018.  CT 05/27/2019. FINDINGS: Patient rotated to the left. PowerPort catheter stable position. Prior CABG. Stable cardiomegaly. Diffuse bilateral pulmonary interstitial prominence consistent with interstitial edema and or pneumonitis. Small right pleural effusion. 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 IMPRESSION: 1.  PowerPort catheter stable position. 2.  Prior CABG.  Stable cardiomegaly. 3. Diffuse bilateral pulmonary interstitial prominence consistent with interstitial edema and or pneumonitis. 4. Calcified basal pleural plaques again noted consistent prior asbestos exposure. Electronically Signed   By: Marcello Moores  Register   On: 01/01/2020 06:08   CT HEAD CODE STROKE WO CONTRAST  Result Date: 01/01/2020 CLINICAL DATA:  Code stroke.  Fixed rightward gaze. EXAM: CT HEAD WITHOUT CONTRAST TECHNIQUE: Contiguous axial images were obtained from the base of the skull  through the vertex without intravenous contrast. COMPARISON:  None. FINDINGS: Brain: There is no mass, hemorrhage or extra-axial collection. There is generalized atrophy without lobar predilection. There is hypoattenuation of the periventricular white matter, most commonly indicating chronic ischemic microangiopathy. There is an old left occipital lobe infarct. Vascular: No abnormal hyperdensity of the major intracranial arteries or dural venous sinuses. No intracranial atherosclerosis. Skull: The visualized skull base, calvarium and extracranial soft tissues are normal.  Sinuses/Orbits: No fluid levels or advanced mucosal thickening of the visualized paranasal sinuses. No mastoid or middle ear effusion. The orbits are normal. ASPECTS [facility] Stroke Program Early CT Score) - Ganglionic level infarction (caudate, lentiform nuclei, internal capsule, insula, M1-M3 cortex): 7 - Supraganglionic infarction (M4-M6 cortex): 3 Total score (0-10 with 10 being normal): 10 IMPRESSION: 1. No acute intracranial abnormality. 2. ASPECTS is 10. 3. 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 These results were communicated to Dr. Donnetta Simpers at 12:58 am on 01/01/2020 by text page via the [facility] messaging system. Electronically Signed   By: Ulyses Jarred M.D.   On: 01/01/2020 01:00   CT ANGIO HEAD CODE STROKE  Result Date: 01/01/2020 CLINICAL DATA:  Fixed rightward gaze EXAM: CT ANGIOGRAPHY HEAD AND NECK TECHNIQUE: Multidetector CT imaging of the head and neck was performed using the standard protocol during bolus administration of intravenous contrast. Multiplanar CT image reconstructions and MIPs were obtained to evaluate the vascular anatomy. Carotid stenosis measurements (when applicable) are obtained utilizing NASCET criteria, using the distal internal carotid diameter as the denominator. CONTRAST:  88mL OMNIPAQUE IOHEXOL 350 MG/ML SOLN COMPARISON:  None. FINDINGS: CTA NECK FINDINGS SKELETON: There is no bony spinal canal stenosis. No lytic or blastic lesion. OTHER NECK: Normal pharynx, larynx and major salivary glands. No cervical lymphadenopathy. Unremarkable thyroid gland. UPPER CHEST: No pneumothorax or pleural effusion. No nodules or masses. AORTIC ARCH: There is no calcific atherosclerosis of the aortic arch. There is no aneurysm, dissection or hemodynamically significant stenosis of the visualized portion of the aorta. Conventional 3 vessel aortic branching pattern. The visualized proximal subclavian arteries are widely patent. RIGHT CAROTID SYSTEM: Normal  without aneurysm, dissection or stenosis. LEFT CAROTID SYSTEM: Normal without aneurysm, dissection or stenosis. VERTEBRAL ARTERIES: Left dominant configuration. Both origins are clearly patent. There is no dissection, occlusion or flow-limiting stenosis to the skull base (V1-V3 segments). CTA HEAD FINDINGS POSTERIOR CIRCULATION: --Vertebral arteries: Normal V4 segments. --Inferior cerebellar arteries: Normal. --Basilar artery: Normal. --Superior cerebellar arteries: Normal. --Posterior cerebral arteries (PCA): Normal. ANTERIOR CIRCULATION: --Intracranial internal carotid arteries: Atherosclerotic calcification of the internal carotid arteries at the skull base without hemodynamically significant stenosis. --Anterior cerebral arteries (ACA): Normal. Both A1 segments are present. Patent anterior communicating artery (a-comm). --Middle cerebral arteries (MCA): Normal. VENOUS SINUSES: As permitted by contrast timing, patent. ANATOMIC VARIANTS: None Review of the MIP images confirms the above findings. IMPRESSION: Normal CTA of the head and neck. Electronically Signed   By: Ulyses Jarred M.D.   On: 01/01/2020 03:24   CT ANGIO NECK CODE STROKE  Result Date: 01/01/2020 CLINICAL DATA:  Fixed rightward gaze EXAM: CT ANGIOGRAPHY HEAD AND NECK TECHNIQUE: Multidetector CT imaging of the head and neck was performed using the standard protocol during bolus administration of intravenous contrast. Multiplanar CT image reconstructions and MIPs were obtained to evaluate the vascular anatomy. Carotid stenosis measurements (when applicable) are obtained utilizing NASCET criteria, using the distal internal carotid diameter as the denominator. CONTRAST:  70mL OMNIPAQUE IOHEXOL 350 MG/ML SOLN COMPARISON:  None. FINDINGS:  CTA NECK FINDINGS SKELETON: There is no bony spinal canal stenosis. No lytic or blastic lesion. OTHER NECK: Normal pharynx, larynx and major salivary glands. No cervical lymphadenopathy. Unremarkable thyroid gland.  UPPER CHEST: No pneumothorax or pleural effusion. No nodules or masses. AORTIC ARCH: There is no calcific atherosclerosis of the aortic arch. There is no aneurysm, dissection or hemodynamically significant stenosis of the visualized portion of the aorta. Conventional 3 vessel aortic branching pattern. The visualized proximal subclavian arteries are widely patent. RIGHT CAROTID SYSTEM: Normal without aneurysm, dissection or stenosis. LEFT CAROTID SYSTEM: Normal without aneurysm, dissection or stenosis. VERTEBRAL ARTERIES: Left dominant configuration. Both origins are clearly patent. There is no dissection, occlusion or flow-limiting stenosis to the skull base (V1-V3 segments). CTA HEAD FINDINGS POSTERIOR CIRCULATION: --Vertebral arteries: Normal V4 segments. --Inferior cerebellar arteries: Normal. --Basilar artery: Normal. --Superior cerebellar arteries: Normal. --Posterior cerebral arteries (PCA): Normal. ANTERIOR CIRCULATION: --Intracranial internal carotid arteries: Atherosclerotic calcification of the internal carotid arteries at the skull base without hemodynamically significant stenosis. --Anterior cerebral arteries (ACA): Normal. Both A1 segments are present. Patent anterior communicating artery (a-comm). --Middle cerebral arteries (MCA): Normal. VENOUS SINUSES: As permitted by contrast timing, patent. ANATOMIC VARIANTS: None Review of the MIP images confirms the above findings. IMPRESSION: Normal CTA of the head and neck. Electronically Signed   By: Ulyses Jarred M.D.   On: 01/01/2020 03:24     Medical Consultants:    None.  Anti-Infectives:   none  Subjective:    Reginald Todd patient is mumbling sleepy and lethargic  Objective:    Vitals:   01/01/20 0529 01/01/20 0530 01/01/20 0628 01/01/20 0645  BP:  (!) 170/132 (!) 148/101 (!) 144/76  Pulse:  (!) 149 76 74  Resp:  18 18 19   Temp: 98.3 F (36.8 C)  98.3 F (36.8 C) 98.3 F (36.8 C)  TempSrc: Rectal  Rectal Oral  SpO2:  95% 99%  97%  Weight: 61.3 kg     Height: 5\' 10"  (1.778 m)      SpO2: 97 %   Intake/Output Summary (Last 24 hours) at 01/01/2020 0658 Last data filed at 01/01/2020 1308 Gross per 24 hour  Intake 276 ml  Output --  Net 276 ml   Filed Weights   01/01/20 0529  Weight: 61.3 kg    Exam: General exam: In no acute distress lethargic able to protect his airways. Respiratory system: Good air movement and clear to auscultation. Cardiovascular system: S1 & S2 heard, RRR. No JVD. Gastrointestinal system: Abdomen is nondistended, soft and nontender.  Central nervous system: Only responsive to pain. Extremities: No pedal edema. Skin: No rashes, lesions or ulcers    Data Reviewed:    Labs: Basic Metabolic Panel: Recent Labs  Lab 01/01/20 0107 01/01/20 0116  NA 140 140  K 3.8 4.0  CL 103 105  CO2  --  17*  GLUCOSE 108* 110*  BUN 29* 27*  CREATININE 3.20* 2.92*  CALCIUM  --  6.4*   GFR Estimated Creatinine Clearance: 16.6 mL/min (A) (by C-G formula based on SCr of 2.92 mg/dL (H)). Liver Function Tests: Recent Labs  Lab 01/01/20 0116  AST 20  ALT 11  ALKPHOS 79  BILITOT 0.4  PROT 5.7*  ALBUMIN 2.3*   No results for input(s): LIPASE, AMYLASE in the last 168 hours. No results for input(s): AMMONIA in the last 168 hours. Coagulation profile Recent Labs  Lab 01/01/20 0149  INR 1.1   COVID-19 Labs  No results for input(s): DDIMER, FERRITIN,  LDH, CRP in the last 72 hours.  No results found for: SARSCOV2NAA  CBC: Recent Labs  Lab 01/01/20 0107 01/01/20 0116  WBC  --  11.1*  NEUTROABS  --  7.5  HGB 10.5* 6.6*  HCT 31.0* 22.9*  MCV  --  96.2  PLT  --  230   Cardiac Enzymes: No results for input(s): CKTOTAL, CKMB, CKMBINDEX, TROPONINI in the last 168 hours. BNP (last 3 results) No results for input(s): PROBNP in the last 8760 hours. CBG: Recent Labs  Lab 01/01/20 0034  GLUCAP 84   D-Dimer: No results for input(s): DDIMER in the last 72 hours. Hgb A1c: No  results for input(s): HGBA1C in the last 72 hours. Lipid Profile: No results for input(s): CHOL, HDL, LDLCALC, TRIG, CHOLHDL, LDLDIRECT in the last 72 hours. Thyroid function studies: No results for input(s): TSH, T4TOTAL, T3FREE, THYROIDAB in the last 72 hours.  Invalid input(s): FREET3 Anemia work up: No results for input(s): VITAMINB12, FOLATE, FERRITIN, TIBC, IRON, RETICCTPCT in the last 72 hours. Sepsis Labs: Recent Labs  Lab 01/01/20 0116  WBC 11.1*   Microbiology No results found for this or any previous visit (from the past 240 hour(s)).   Medications:   . sodium chloride   Intravenous Once   Continuous Infusions: . sodium chloride    . calcium gluconate        LOS: 0 days   Charlynne Cousins  [facility]  01/01/2020, 6:58 AM

## 2020-01-01 NOTE — ED Notes (Signed)
Downtime note: Pt arrived to ED via EMS, code stroke called PTA- neurologist and ED MD at bedside.No family/friends with patient or in ED. Pt unable to answer questions and not speaking. Pt cleared for CT. Pt had CT Code Stroke, then 2 RN's, charge nurse & phlebotomy attempted multiple times for IV for CT w perfusion. Unable to obtain IV access. MD made aware. Pt brought to room, while changing pt into gown, RN noticed pt has a power port. Port accessed,  MD made aware. Labs sent. CT called and stated unable to use port access for CT w perfusion, need to access port w power port access. Kathie Rhodes RN accessed port both times without complications/using sterile technique. Pt received 2g Keppra IV. Code Stroke cancelled at [facility] per neurologist Koleen Nimrod). Pt became confused, trying to get out of bed, removing leads, and pulling at port access- pt given 0.5mg  ativan IV per MD orders.  Pt resting in bed, seizure precautions in place, chest rising and falling, pt in NAD.

## 2020-01-01 NOTE — ED Notes (Signed)
Condom cath placed on pt 

## 2020-01-01 NOTE — ED Notes (Signed)
Attempted to call report x 1, will call back

## 2020-01-01 NOTE — ED Provider Notes (Signed)
Patient is more alert, but not back to baseline.  He is mildly agitated.  Stool Hemoccult was positive, but stool was liquid brown  suspect this is an acute on chronic anemia Patient is awaiting admission   Ripley Fraise, MD 01/01/20 [id number]

## 2020-01-01 NOTE — ED Notes (Signed)
Off floor to MRI, Pt stable

## 2020-01-01 NOTE — Progress Notes (Signed)
EEG complete - results pending 

## 2020-01-01 NOTE — Procedures (Signed)
Patient Name: Sekou Zuckerman  MRN: [id number]  Epilepsy Attending: Lora Havens  Referring Physician/Provider: Dr. Donnetta Simpers Date: 8/90/2021 Duration: 24.39 minutes  Patient history: 86 year old male presented with altered mental status and right gaze deviation as well as seizure-like activity.  EEG evaluate for seizures.  Level of alertness: lethargic  AEDs during EEG study: LEV  Technical aspects: This EEG study was done with scalp electrodes positioned according to the 10-20 International system of electrode placement. Electrical activity was acquired at a sampling rate of 500Hz  and reviewed with a high frequency filter of 70Hz  and a low frequency filter of 1Hz . EEG data were recorded continuously and digitally stored.   Description: No clear posterior dominant was seen.  EEG showed continuous generalized 3 to 5 Hz theta-delta slowing.  Hyperventilation and photic stimulation were not performed.     ABNORMALITY -Continuous slow, generalized  IMPRESSION: This study is suggestive ofmoderate diffuse encephalopathy, nonspecific etiology. No seizures or definite epileptiform discharges were seen throughout the recording.  Lakeyn Dokken Barbra Sarks

## 2020-01-01 NOTE — ED Provider Notes (Signed)
[facility] EMERGENCY DEPARTMENT Provider Note   CSN: [id number] Arrival date & time: 01/01/20  0814     History Chief complaint - weakness Level five caveat due to acuity of condition  Reginald Todd is a 86 y.o. male.  The history is provided by the EMS personnel.  Altered Mental Status Presenting symptoms: lethargy   Severity:  Severe Timing:  Constant Progression:  Worsening Chronicity:  New Associated symptoms: abnormal movement    Patient presents as a possible code stroke. Patient seen on arrival to the ER. No other details are known at this time     PMH- colon CA Soc hx - unknown  Social History   Tobacco Use  . Smoking status: Not on file  Substance Use Topics  . Alcohol use: Not on file  . Drug use: Not on file    Home Medications Prior to Admission medications   Not on File    Allergies    Patient has no allergy information on record.  Review of Systems   Review of Systems  Unable to perform ROS: Acuity of condition    Physical Exam Updated Vital Signs Pulse 86   Resp (!) 29   SpO2 100%   Physical Exam CONSTITUTIONAL: Elderly, ill-appearing HEAD: Normocephalic/atraumatic EYES: PERRL, pupils pinpoint, eyes deviated to the right ENMT: Mucous membranes moist NECK: supple no meningeal signs CV: S1/S2 noted LUNGS: Lungs are clear to auscultation bilaterally, no apparent distress ABDOMEN: soft, colostomy in place.  Healed surgical scars noted NEURO: Pt is somnolent.  He will arouse to painful stimuli.  He will move all extremities x4.  No obvious facial droop. EXTREMITIES: pulses normal/equal, full ROM, no obvious trauma or deformity SKIN: warm, color normal PSYCH: Unable to assess  ED Results / Procedures / Treatments   Labs (all labs ordered are listed, but only abnormal results are displayed) Labs Reviewed  CBC WITH DIFFERENTIAL/PLATELET - Abnormal; Notable for the following components:      Result Value   WBC 11.1  (*)    RBC 2.38 (*)    Hemoglobin 6.6 (*)    HCT 22.9 (*)    MCHC 28.8 (*)    Monocytes Absolute 1.1 (*)    Abs Immature Granulocytes 0.11 (*)    All other components within normal limits  COMPREHENSIVE METABOLIC PANEL - Abnormal; Notable for the following components:   CO2 17 (*)    Glucose, Bld 110 (*)    BUN 27 (*)    Creatinine, Ser 2.92 (*)    Calcium 6.4 (*)    Total Protein 5.7 (*)    Albumin 2.3 (*)    GFR calc non Af Amer 19 (*)    GFR calc Af Amer 22 (*)    Anion gap 18 (*)    All other components within normal limits  I-STAT CHEM 8, ED - Abnormal; Notable for the following components:   BUN 29 (*)    Creatinine, Ser 3.20 (*)    Glucose, Bld 108 (*)    Calcium, Ion 0.81 (*)    TCO2 19 (*)    Hemoglobin 10.5 (*)    HCT 31.0 (*)    All other components within normal limits  PROTIME-INR  APTT  CBG MONITORING, ED  POC OCCULT BLOOD, ED    EKG EKG Interpretation  Date/Time:  Thursday January 01 2020 02:53:02 EDT Ventricular Rate:  82 PR Interval:    QRS Duration: 95 QT Interval:  427 QTC Calculation: 499 R Axis:  20 Text Interpretation: Sinus rhythm Borderline repolarization abnormality Borderline prolonged QT interval No previous ECGs available Confirmed by Ripley Fraise [id number]) on 01/01/2020 3:20:51 AM   Radiology CT HEAD CODE STROKE WO CONTRAST  Result Date: 01/01/2020 CLINICAL DATA:  Code stroke.  Fixed rightward gaze. EXAM: CT HEAD WITHOUT CONTRAST TECHNIQUE: Contiguous axial images were obtained from the base of the skull through the vertex without intravenous contrast. COMPARISON:  None. FINDINGS: Brain: There is no mass, hemorrhage or extra-axial collection. There is generalized atrophy without lobar predilection. There is hypoattenuation of the periventricular white matter, most commonly indicating chronic ischemic microangiopathy. There is an old left occipital lobe infarct. Vascular: No abnormal hyperdensity of the major intracranial arteries or  dural venous sinuses. No intracranial atherosclerosis. Skull: The visualized skull base, calvarium and extracranial soft tissues are normal. Sinuses/Orbits: No fluid levels or advanced mucosal thickening of the visualized paranasal sinuses. No mastoid or middle ear effusion. The orbits are normal. ASPECTS [facility] Stroke Program Early CT Score) - Ganglionic level infarction (caudate, lentiform nuclei, internal capsule, insula, M1-M3 cortex): 7 - Supraganglionic infarction (M4-M6 cortex): 3 Total score (0-10 with 10 being normal): 10 IMPRESSION: 1. No acute intracranial abnormality. 2. ASPECTS is 10. 3. 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 These results were communicated to Dr. Donnetta Simpers at 12:58 am on 01/01/2020 by text page via the [facility] messaging system. Electronically Signed   By: Ulyses Jarred M.D.   On: 01/01/2020 01:00   CT ANGIO HEAD CODE STROKE  Result Date: 01/01/2020 CLINICAL DATA:  Fixed rightward gaze EXAM: CT ANGIOGRAPHY HEAD AND NECK TECHNIQUE: Multidetector CT imaging of the head and neck was performed using the standard protocol during bolus administration of intravenous contrast. Multiplanar CT image reconstructions and MIPs were obtained to evaluate the vascular anatomy. Carotid stenosis measurements (when applicable) are obtained utilizing NASCET criteria, using the distal internal carotid diameter as the denominator. CONTRAST:  26mL OMNIPAQUE IOHEXOL 350 MG/ML SOLN COMPARISON:  None. FINDINGS: CTA NECK FINDINGS SKELETON: There is no bony spinal canal stenosis. No lytic or blastic lesion. OTHER NECK: Normal pharynx, larynx and major salivary glands. No cervical lymphadenopathy. Unremarkable thyroid gland. UPPER CHEST: No pneumothorax or pleural effusion. No nodules or masses. AORTIC ARCH: There is no calcific atherosclerosis of the aortic arch. There is no aneurysm, dissection or hemodynamically significant stenosis of the visualized portion of the  aorta. Conventional 3 vessel aortic branching pattern. The visualized proximal subclavian arteries are widely patent. RIGHT CAROTID SYSTEM: Normal without aneurysm, dissection or stenosis. LEFT CAROTID SYSTEM: Normal without aneurysm, dissection or stenosis. VERTEBRAL ARTERIES: Left dominant configuration. Both origins are clearly patent. There is no dissection, occlusion or flow-limiting stenosis to the skull base (V1-V3 segments). CTA HEAD FINDINGS POSTERIOR CIRCULATION: --Vertebral arteries: Normal V4 segments. --Inferior cerebellar arteries: Normal. --Basilar artery: Normal. --Superior cerebellar arteries: Normal. --Posterior cerebral arteries (PCA): Normal. ANTERIOR CIRCULATION: --Intracranial internal carotid arteries: Atherosclerotic calcification of the internal carotid arteries at the skull base without hemodynamically significant stenosis. --Anterior cerebral arteries (ACA): Normal. Both A1 segments are present. Patent anterior communicating artery (a-comm). --Middle cerebral arteries (MCA): Normal. VENOUS SINUSES: As permitted by contrast timing, patent. ANATOMIC VARIANTS: None Review of the MIP images confirms the above findings. IMPRESSION: Normal CTA of the head and neck. Electronically Signed   By: Ulyses Jarred M.D.   On: 01/01/2020 03:24   CT ANGIO NECK CODE STROKE  Result Date: 01/01/2020 CLINICAL DATA:  Fixed rightward gaze EXAM: CT ANGIOGRAPHY HEAD AND NECK TECHNIQUE:  Multidetector CT imaging of the head and neck was performed using the standard protocol during bolus administration of intravenous contrast. Multiplanar CT image reconstructions and MIPs were obtained to evaluate the vascular anatomy. Carotid stenosis measurements (when applicable) are obtained utilizing NASCET criteria, using the distal internal carotid diameter as the denominator. CONTRAST:  25mL OMNIPAQUE IOHEXOL 350 MG/ML SOLN COMPARISON:  None. FINDINGS: CTA NECK FINDINGS SKELETON: There is no bony spinal canal stenosis. No  lytic or blastic lesion. OTHER NECK: Normal pharynx, larynx and major salivary glands. No cervical lymphadenopathy. Unremarkable thyroid gland. UPPER CHEST: No pneumothorax or pleural effusion. No nodules or masses. AORTIC ARCH: There is no calcific atherosclerosis of the aortic arch. There is no aneurysm, dissection or hemodynamically significant stenosis of the visualized portion of the aorta. Conventional 3 vessel aortic branching pattern. The visualized proximal subclavian arteries are widely patent. RIGHT CAROTID SYSTEM: Normal without aneurysm, dissection or stenosis. LEFT CAROTID SYSTEM: Normal without aneurysm, dissection or stenosis. VERTEBRAL ARTERIES: Left dominant configuration. Both origins are clearly patent. There is no dissection, occlusion or flow-limiting stenosis to the skull base (V1-V3 segments). CTA HEAD FINDINGS POSTERIOR CIRCULATION: --Vertebral arteries: Normal V4 segments. --Inferior cerebellar arteries: Normal. --Basilar artery: Normal. --Superior cerebellar arteries: Normal. --Posterior cerebral arteries (PCA): Normal. ANTERIOR CIRCULATION: --Intracranial internal carotid arteries: Atherosclerotic calcification of the internal carotid arteries at the skull base without hemodynamically significant stenosis. --Anterior cerebral arteries (ACA): Normal. Both A1 segments are present. Patent anterior communicating artery (a-comm). --Middle cerebral arteries (MCA): Normal. VENOUS SINUSES: As permitted by contrast timing, patent. ANATOMIC VARIANTS: None Review of the MIP images confirms the above findings. IMPRESSION: Normal CTA of the head and neck. Electronically Signed   By: Ulyses Jarred M.D.   On: 01/01/2020 03:24    Procedures .Critical Care Performed by: Ripley Fraise, MD Authorized by: Ripley Fraise, MD   Critical care provider statement:    Critical care time (minutes):  45   Critical care start time:  01/01/2020 12:30 AM   Critical care end time:  01/01/2020 12:15 AM    Critical care was necessary to treat or prevent imminent or life-threatening deterioration of the following conditions:  CNS failure or compromise   Critical care was time spent personally by me on the following activities:  Ordering and review of laboratory studies, pulse oximetry, ordering and review of radiographic studies, re-evaluation of patient's condition, examination of patient, discussions with consultants, development of treatment plan with patient or surrogate and evaluation of patient's response to treatment   I assumed direction of critical care for this patient from another provider in my specialty: no     Medications Ordered in ED Medications  sodium chloride flush (NS) 0.9 % injection 3 mL (has no administration in time range)  levETIRAcetam (KEPPRA) IVPB 1000 mg/100 mL premix (has no administration in time range)  calcium gluconate 1 g/ 50 mL sodium chloride IVPB (has no administration in time range)  iohexol (OMNIPAQUE) 350 MG/ML injection 75 mL (75 mLs Intravenous Contrast Given 01/01/20 0319)    ED Course  I have reviewed the triage vital signs and the nursing notes.  Pertinent labs & imaging results that were available during my care of the patient were reviewed by me and considered in my medical decision making (see chart for details).    MDM Rules/Calculators/A&P                           3:33 AM Patient seen on arrival  as a code stroke.  Reported last known well was approximately 2200.  Patient was then noted to be confused and looking to the right.  EMS reported in transit patient had posturing and was given Versed.  On arrival to the ER he was very somnolent but did respond to pain moves all extremities x4.  Emergent CT head was negative.  After discussion with neurology on-call, it was felt that this likely represented a seizure with prolonged postictal period.  Patient is now resting comfortably, but not back to baseline.  No obvious seizure activity has been  witnessed.  Patient has significant lab abnormalities including anemia, renal failure, hypocalcemia. After discussion with neurology Dr. Liliane Channel be admitted to the medical service.  He will require continuous EEG monitoring 4:01 AM Discussed the case with Dr. Hal Hope for admission.  Current tests that are pending include stool Hemoccult.  Patient may need blood transfusion, but currently is hemodynamically appropriate.  Unclear acuity of anemia.    This patient presents to the ED for concern of altered mental status, this involves an extensive number of treatment options, and is a complaint that carries with it a high risk of complications and morbidity.  The differential diagnosis includes stroke, meningitis, status epilepticus, metabolic derangement, UTI   Lab Tests:   I Ordered, reviewed, and interpreted labs, which included electrolytes, complete blood count-patient with anemia, hypocalcemia and acute renal failure  Medicines ordered:   I ordered medication Keppra for seizures  Calcium gluconate for hypocalcemia  Imaging Studies ordered:   I ordered imaging studies which included CT head   I independently visualized and interpreted imaging which showed no acute findings    Consultations Obtained:   I consulted neurology and discussed lab and imaging findings  Reevaluation:  After the interventions stated above, I reevaluated the patient and found patient is stable   Final Clinical Impression(s) / ED Diagnoses Final diagnoses:  Status epilepticus ([place])  AKI (acute kidney injury) ([place])  Hypocalcemia    Rx / [place] Orders ED Discharge Orders    None       Ripley Fraise, MD 01/01/20 [id number]

## 2020-01-02 LAB — TYPE AND SCREEN
ABO/RH(D): AB POS
Antibody Screen: NEGATIVE
Unit division: 0

## 2020-01-02 LAB — GLUCOSE, CAPILLARY
Glucose-Capillary: 103 mg/dL — ABNORMAL HIGH (ref 70–99)
Glucose-Capillary: 116 mg/dL — ABNORMAL HIGH (ref 70–99)
Glucose-Capillary: 122 mg/dL — ABNORMAL HIGH (ref 70–99)
Glucose-Capillary: 96 mg/dL (ref 70–99)

## 2020-01-02 LAB — BPAM RBC
Blood Product Expiration Date: 202109042359
ISSUE DATE / TIME: 202108190624
Unit Type and Rh: 8400

## 2020-01-02 LAB — CBC
HCT: 24 % — ABNORMAL LOW (ref 39.0–52.0)
Hemoglobin: 7.5 g/dL — ABNORMAL LOW (ref 13.0–17.0)
MCH: 27.6 pg (ref 26.0–34.0)
MCHC: 31.3 g/dL (ref 30.0–36.0)
MCV: 88.2 fL (ref 80.0–100.0)
Platelets: 196 10*3/uL (ref 150–400)
RBC: 2.72 MIL/uL — ABNORMAL LOW (ref 4.22–5.81)
RDW: 15.5 % (ref 11.5–15.5)
WBC: 8.1 10*3/uL (ref 4.0–10.5)
nRBC: 0 % (ref 0.0–0.2)

## 2020-01-02 LAB — BASIC METABOLIC PANEL
Anion gap: 12 (ref 5–15)
BUN: 27 mg/dL — ABNORMAL HIGH (ref 8–23)
CO2: 24 mmol/L (ref 22–32)
Calcium: 6.8 mg/dL — ABNORMAL LOW (ref 8.9–10.3)
Chloride: 103 mmol/L (ref 98–111)
Creatinine, Ser: 2.82 mg/dL — ABNORMAL HIGH (ref 0.61–1.24)
GFR calc Af Amer: 23 mL/min — ABNORMAL LOW (ref >60)
GFR calc non Af Amer: 20 mL/min — ABNORMAL LOW (ref >60)
Glucose, Bld: 127 mg/dL — ABNORMAL HIGH (ref 70–99)
Potassium: 3.5 mmol/L (ref 3.5–5.1)
Sodium: 139 mmol/L (ref 135–145)

## 2020-01-02 NOTE — Progress Notes (Addendum)
[facility] PROGRESS NOTE    Progress Note  Reginald Todd  WVP:[id number] DOB: [birth year] DOA: 01/01/2020 PCP: Rochel Brome, MD     Brief Narrative:   Reginald Todd is an 86 y.o. male past medical history of colon cancer status post hemicolectomy with colostomy and chemotherapy in June 2021 at [facility] with a history of CAD status post CABG psoriasis on methotrexate was found slightly confused with right-sided gaze around 10 PM the day prior to admission, CT angio of the head and neck did not show any large vessel obstruction, there was a concern for seizures as she was loaded with Keppra and continuous EEG was placed.  Assessment/Plan:   Acute encephalopathy EEG and MRI have been ordered.  She was loaded with Keppra and will continue this IV. No further seizure episodes continue Keppra. Consulted physical therapy awaiting evaluation.  Acute blood loss anemia with a history of colon cancer: with last chemotherapy in June 2021, FOBT positive. She is getting 1 unit of packed red blood cells.   CBC post transfusional pending.  Allow diet. CAD status post CABG: Continue current home medications once he was able to take p.o.'s.  Essential hypertension: Continue hydralazine IV as needed for now.  Psoriatic arthritis: On methotrexate, cont.  AKI: Unclear baseline, unknown her medication list. Started on IV fluid hydration basic metabolic panel is pending today.  Continue current fluids.  Post-ictal state [facility]) Patient is still post ictal we will continue to monitor.    DVT prophylaxis: SCD Family Communication:none Status is: Inpatient  Remains inpatient appropriate because:Hemodynamically unstable   Dispo: The patient is from: Home              Anticipated d/c is to: SNF              Anticipated d/c date is: 1              Patient currently is not medically stable to d/c.  Need to see if his creatinine will improve, and also awaiting physical  therapy.        Code Status:     Code Status Orders  (From admission, onward)         Start     Ordered   01/01/20 0521  Full code  Continuous        01/01/20 0522        Code Status History    This patient has a current code status but no historical code status.   Advance Care Planning Activity        IV Access:    Peripheral IV   Procedures and diagnostic studies:   EEG  Result Date: 01/01/2020 Lora Havens, MD     01/01/2020 11:06 AM Patient Name: Reginald Todd MRN: [id number] Epilepsy Attending: Lora Havens Referring Physician/Provider: Dr. Donnetta Simpers Date: 8/90/2021 Duration: 24.39 minutes Patient history: 86 year old male presented with altered mental status and right gaze deviation as well as seizure-like activity.  EEG evaluate for seizures. Level of alertness: lethargic AEDs during EEG study: LEV Technical aspects: This EEG study was done with scalp electrodes positioned according to the 10-20 International system of electrode placement. Electrical activity was acquired at a sampling rate of 500Hz  and reviewed with a high frequency filter of 70Hz  and a low frequency filter of 1Hz . EEG data were recorded continuously and digitally stored. Description: No clear posterior dominant was seen.  EEG showed continuous generalized 3 to 5 Hz theta-delta slowing.  Hyperventilation and photic stimulation  were not performed.   ABNORMALITY -Continuous slow, generalized IMPRESSION: This study is suggestive ofmoderate diffuse encephalopathy, nonspecific etiology. No seizures or definite epileptiform discharges were seen throughout the recording. Lora Havens   MR BRAIN WO CONTRAST  Result Date: 01/01/2020 CLINICAL DATA:  86 year old male with history of colon cancer status post chemotherapy in June. Confusion with right side gaze preference 2200 hours yesterday. EXAM: MRI HEAD WITHOUT CONTRAST TECHNIQUE: Multiplanar, multiecho pulse sequences of the brain and  surrounding structures were obtained without intravenous contrast. COMPARISON:  CT head and CTA head and neck earlier today. FINDINGS: Brain: No restricted diffusion or evidence of acute infarction. 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 Small area of chronic cortical encephalomalacia in the left lateral occipital lobe. Occasional chronic micro hemorrhages, including in the right posterior lentiform on series 8, image 37. Hippocampal formations and mesial temporal lobes appear symmetric and within normal limits for age. Patchy and confluent bilateral cerebral white matter T2 and FLAIR hyperintensity. T2 heterogeneity in the bilateral basal ganglia appears more related to perivascular spaces. Mild T2 heterogeneity in the pons. No midline shift, mass effect, evidence of mass lesion, ventriculomegaly, extra-axial collection or acute intracranial hemorrhage. Cervicomedullary junction and pituitary are within normal limits. Vascular: Major intracranial vascular flow voids are preserved. Skull and upper cervical spine: Negative for age visible cervical spine, bone marrow signal. Sinuses/Orbits: Postoperative changes to both globes. Otherwise negative orbits. Trace paranasal sinus mucosal thickening. Other: Mastoids are clear. Visible internal auditory structures appear normal. Trace retained secretions in the nasopharynx. IMPRESSION: 1. No acute intracranial abnormality. 2. 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 3. Moderate for age signal changes in the cerebral white matter, basal ganglia, and pons, most commonly due to chronic small vessel disease. Electronically Signed   By: Genevie Ann M.D.   On: 01/01/2020 12:33   DG CHEST PORT 1 VIEW  Result Date: 01/01/2020 CLINICAL DATA:  Seizure. EXAM: PORTABLE CHEST 1 VIEW COMPARISON:  Chest x-ray 05/31/2019, 03/15/2018.  CT 05/27/2019. FINDINGS: Patient rotated to the left. PowerPort catheter stable position. Prior CABG. Stable  cardiomegaly. Diffuse bilateral pulmonary interstitial prominence consistent with interstitial edema and or pneumonitis. Small right pleural effusion. 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 IMPRESSION: 1.  PowerPort catheter stable position. 2.  Prior CABG.  Stable cardiomegaly. 3. Diffuse bilateral pulmonary interstitial prominence consistent with interstitial edema and or pneumonitis. 4. Calcified basal pleural plaques again noted consistent prior asbestos exposure. Electronically Signed   By: Marcello Moores  Register   On: 01/01/2020 06:08   CT HEAD CODE STROKE WO CONTRAST  Result Date: 01/01/2020 CLINICAL DATA:  Code stroke.  Fixed rightward gaze. EXAM: CT HEAD WITHOUT CONTRAST TECHNIQUE: Contiguous axial images were obtained from the base of the skull through the vertex without intravenous contrast. COMPARISON:  None. FINDINGS: Brain: There is no mass, hemorrhage or extra-axial collection. There is generalized atrophy without lobar predilection. There is hypoattenuation of the periventricular white matter, most commonly indicating chronic ischemic microangiopathy. There is an old left occipital lobe infarct. Vascular: No abnormal hyperdensity of the major intracranial arteries or dural venous sinuses. No intracranial atherosclerosis. Skull: The visualized skull base, calvarium and extracranial soft tissues are normal. Sinuses/Orbits: No fluid levels or advanced mucosal thickening of the visualized paranasal sinuses. No mastoid or middle ear effusion. The orbits are normal. ASPECTS [facility] Stroke Program Early CT Score) - Ganglionic level infarction (caudate, lentiform nuclei, internal capsule, insula, M1-M3 cortex): 7 - Supraganglionic  infarction (M4-M6 cortex): 3 Total score (0-10 with 10 being normal): 10 IMPRESSION: 1. No acute intracranial abnormality. 2. ASPECTS is 10. 3. 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 These results were  communicated to Dr. Donnetta Simpers at 12:58 am on 01/01/2020 by text page via the [facility] messaging system. Electronically Signed   By: Ulyses Jarred M.D.   On: 01/01/2020 01:00   CT ANGIO HEAD CODE STROKE  Result Date: 01/01/2020 CLINICAL DATA:  Fixed rightward gaze EXAM: CT ANGIOGRAPHY HEAD AND NECK TECHNIQUE: Multidetector CT imaging of the head and neck was performed using the standard protocol during bolus administration of intravenous contrast. Multiplanar CT image reconstructions and MIPs were obtained to evaluate the vascular anatomy. Carotid stenosis measurements (when applicable) are obtained utilizing NASCET criteria, using the distal internal carotid diameter as the denominator. CONTRAST:  17mL OMNIPAQUE IOHEXOL 350 MG/ML SOLN COMPARISON:  None. FINDINGS: CTA NECK FINDINGS SKELETON: There is no bony spinal canal stenosis. No lytic or blastic lesion. OTHER NECK: Normal pharynx, larynx and major salivary glands. No cervical lymphadenopathy. Unremarkable thyroid gland. UPPER CHEST: No pneumothorax or pleural effusion. No nodules or masses. AORTIC ARCH: There is no calcific atherosclerosis of the aortic arch. There is no aneurysm, dissection or hemodynamically significant stenosis of the visualized portion of the aorta. Conventional 3 vessel aortic branching pattern. The visualized proximal subclavian arteries are widely patent. RIGHT CAROTID SYSTEM: Normal without aneurysm, dissection or stenosis. LEFT CAROTID SYSTEM: Normal without aneurysm, dissection or stenosis. VERTEBRAL ARTERIES: Left dominant configuration. Both origins are clearly patent. There is no dissection, occlusion or flow-limiting stenosis to the skull base (V1-V3 segments). CTA HEAD FINDINGS POSTERIOR CIRCULATION: --Vertebral arteries: Normal V4 segments. --Inferior cerebellar arteries: Normal. --Basilar artery: Normal. --Superior cerebellar arteries: Normal. --Posterior cerebral arteries (PCA): Normal. ANTERIOR CIRCULATION:  --Intracranial internal carotid arteries: Atherosclerotic calcification of the internal carotid arteries at the skull base without hemodynamically significant stenosis. --Anterior cerebral arteries (ACA): Normal. Both A1 segments are present. Patent anterior communicating artery (a-comm). --Middle cerebral arteries (MCA): Normal. VENOUS SINUSES: As permitted by contrast timing, patent. ANATOMIC VARIANTS: None Review of the MIP images confirms the above findings. IMPRESSION: Normal CTA of the head and neck. Electronically Signed   By: Ulyses Jarred M.D.   On: 01/01/2020 03:24   CT ANGIO NECK CODE STROKE  Result Date: 01/01/2020 CLINICAL DATA:  Fixed rightward gaze EXAM: CT ANGIOGRAPHY HEAD AND NECK TECHNIQUE: Multidetector CT imaging of the head and neck was performed using the standard protocol during bolus administration of intravenous contrast. Multiplanar CT image reconstructions and MIPs were obtained to evaluate the vascular anatomy. Carotid stenosis measurements (when applicable) are obtained utilizing NASCET criteria, using the distal internal carotid diameter as the denominator. CONTRAST:  93mL OMNIPAQUE IOHEXOL 350 MG/ML SOLN COMPARISON:  None. FINDINGS: CTA NECK FINDINGS SKELETON: There is no bony spinal canal stenosis. No lytic or blastic lesion. OTHER NECK: Normal pharynx, larynx and major salivary glands. No cervical lymphadenopathy. Unremarkable thyroid gland. UPPER CHEST: No pneumothorax or pleural effusion. No nodules or masses. AORTIC ARCH: There is no calcific atherosclerosis of the aortic arch. There is no aneurysm, dissection or hemodynamically significant stenosis of the visualized portion of the aorta. Conventional 3 vessel aortic branching pattern. The visualized proximal subclavian arteries are widely patent. RIGHT CAROTID SYSTEM: Normal without aneurysm, dissection or stenosis. LEFT CAROTID SYSTEM: Normal without aneurysm, dissection or stenosis. VERTEBRAL ARTERIES: Left dominant  configuration. Both origins are clearly patent. There is no dissection, occlusion or flow-limiting stenosis to  the skull base (V1-V3 segments). CTA HEAD FINDINGS POSTERIOR CIRCULATION: --Vertebral arteries: Normal V4 segments. --Inferior cerebellar arteries: Normal. --Basilar artery: Normal. --Superior cerebellar arteries: Normal. --Posterior cerebral arteries (PCA): Normal. ANTERIOR CIRCULATION: --Intracranial internal carotid arteries: Atherosclerotic calcification of the internal carotid arteries at the skull base without hemodynamically significant stenosis. --Anterior cerebral arteries (ACA): Normal. Both A1 segments are present. Patent anterior communicating artery (a-comm). --Middle cerebral arteries (MCA): Normal. VENOUS SINUSES: As permitted by contrast timing, patent. ANATOMIC VARIANTS: None Review of the MIP images confirms the above findings. IMPRESSION: Normal CTA of the head and neck. Electronically Signed   By: Ulyses Jarred M.D.   On: 01/01/2020 03:24     Medical Consultants:    None.  Anti-Infectives:   none  Subjective:    Reginald Todd awake this morning he relates he is hungry no new complaints. Objective:    Vitals:   01/01/20 2115 01/01/20 2157 01/02/20 0002 01/02/20 0358  BP: (!) 151/80 (!) 143/83 132/67 136/80  Pulse: (!) 57 67 69 71  Resp: 17 20 16 18   Temp:  99 F (37.2 C) 97.6 F (36.4 C) 98.2 F (36.8 C)  TempSrc:  Oral Axillary Oral  SpO2: 100% 94% 99% 100%  Weight:  60.6 kg    Height:  5\' 8"  (1.727 m)     SpO2: 100 % O2 Flow Rate (L/min): 2 L/min   Intake/Output Summary (Last 24 hours) at 01/02/2020 1001 Last data filed at 01/02/2020 0600 Gross per 24 hour  Intake 530 ml  Output 300 ml  Net 230 ml   Filed Weights   01/01/20 0529 01/01/20 2157  Weight: 61.3 kg 60.6 kg    Exam: General exam: In no acute distress. Respiratory system: Good air movement and clear to auscultation. Cardiovascular system: S1 & S2 heard, RRR. No  JVD. Gastrointestinal system: Abdomen is nondistended, soft and nontender.  Extremities: No pedal edema. Skin: No rashes, lesions or ulcers Psychiatry: Judgement and insight appear normal. Mood & affect appropriate.    Data Reviewed:    Labs: Basic Metabolic Panel: Recent Labs  Lab 01/01/20 0107 01/01/20 0116  NA 140 140  K 3.8 4.0  CL 103 105  CO2  --  17*  GLUCOSE 108* 110*  BUN 29* 27*  CREATININE 3.20* 2.92*  CALCIUM  --  6.4*   GFR Estimated Creatinine Clearance: 16.4 mL/min (A) (by C-G formula based on SCr of 2.92 mg/dL (H)). Liver Function Tests: Recent Labs  Lab 01/01/20 0116  AST 20  ALT 11  ALKPHOS 79  BILITOT 0.4  PROT 5.7*  ALBUMIN 2.3*   No results for input(s): LIPASE, AMYLASE in the last 168 hours. No results for input(s): AMMONIA in the last 168 hours. Coagulation profile Recent Labs  Lab 01/01/20 0149  INR 1.1   COVID-19 Labs  No results for input(s): DDIMER, FERRITIN, LDH, CRP in the last 72 hours.  Lab Results  Component Value Date   [place] NEGATIVE 01/01/2020    CBC: Recent Labs  Lab 01/01/20 0107 01/01/20 0116  WBC  --  11.1*  NEUTROABS  --  7.5  HGB 10.5* 6.6*  HCT 31.0* 22.9*  MCV  --  96.2  PLT  --  230   Cardiac Enzymes: No results for input(s): CKTOTAL, CKMB, CKMBINDEX, TROPONINI in the last 168 hours. BNP (last 3 results) No results for input(s): PROBNP in the last 8760 hours. CBG: Recent Labs  Lab 01/01/20 1446 01/01/20 1802 01/01/20 1857 01/02/20 0001 01/02/20 0086  GLUCAP 68* 61* 80 116* 96   D-Dimer: No results for input(s): DDIMER in the last 72 hours. Hgb A1c: No results for input(s): HGBA1C in the last 72 hours. Lipid Profile: No results for input(s): CHOL, HDL, LDLCALC, TRIG, CHOLHDL, LDLDIRECT in the last 72 hours. Thyroid function studies: No results for input(s): TSH, T4TOTAL, T3FREE, THYROIDAB in the last 72 hours.  Invalid input(s): FREET3 Anemia work up: No results for input(s):  VITAMINB12, FOLATE, FERRITIN, TIBC, IRON, RETICCTPCT in the last 72 hours. Sepsis Labs: Recent Labs  Lab 01/01/20 0116  WBC 11.1*   Microbiology Recent Results (from the past 240 hour(s))  SARS Coronavirus 2 by RT PCR (hospital order, performed in [facility] hospital lab) Nasopharyngeal Nasopharyngeal Swab     Status: None   Collection Time: 01/01/20  6:12 AM   Specimen: Nasopharyngeal Swab  Result Value Ref Range Status   SARS Coronavirus 2 NEGATIVE NEGATIVE Final    Comment: (NOTE) SARS-CoV-2 target nucleic acids are NOT DETECTED.  The SARS-CoV-2 RNA is generally detectable in upper and lower respiratory specimens during the acute phase of infection. The lowest concentration of SARS-CoV-2 viral copies this assay can detect is 250 copies / mL. A negative result does not preclude SARS-CoV-2 infection and should not be used as the sole basis for treatment or other patient management decisions.  A negative result may occur with improper specimen collection / handling, submission of specimen other than nasopharyngeal swab, presence of viral mutation(s) within the areas targeted by this assay, and inadequate number of viral copies (<250 copies / mL). A negative result must be combined with clinical observations, patient history, and epidemiological information.  Fact Sheet for Patients:   StrictlyIdeas.no  Fact Sheet for Healthcare Providers: BankingDealers.co.za  This test is not yet approved or  cleared by the [place] FDA and has been authorized for detection and/or diagnosis of SARS-CoV-2 by FDA under an Emergency Use Authorization (EUA).  This EUA will remain in effect (meaning this test can be used) for the duration of the COVID-19 declaration under Section 564(b)(1) of the Act, 21 U.S.C. section 360bbb-3(b)(1), unless the authorization is terminated or revoked sooner.  Performed at [facility], [place] [address], [place], [place] [address]      Medications:   . sodium chloride   Intravenous Once  . Chlorhexidine Gluconate Cloth  6 each Topical Daily  . levETIRAcetam  500 mg Oral Daily   Continuous Infusions: . dextrose 5 % and 0.45% NaCl 50 mL/hr at 01/02/20 0938      LOS: 1 day   Charlynne Cousins  [facility]  01/02/2020, 10:01 AM

## 2020-01-02 NOTE — Evaluation (Signed)
Physical Therapy Evaluation Patient Details Name: Reginald Todd MRN: [id number] DOB: Sep 26, 1933 Today's Date: 01/02/2020   History of Present Illness  This 86 y.o. male admitted with AMS and Rt sided gaze preference. CTA showed no LVO.  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  Dx:  possible seizure and acute encephalopathy.  PMH includes: CAD, s/p CABG, psoriasis on Methotrexate, s/p colon CA with hemicolectomy - last chemo 10/2019  Clinical Impression  Pt admitted with/for s/s of stroke, but with no acute intracranial abnormality.  Pt does show changes from baseline per patient and needs min guard assist in general.  Pt currently limited functionally due to the problems listed below.  (see problems list.)  Pt will benefit from PT to maximize function and safety to be able to get home safely with available assist of his wife.     Follow Up Recommendations Home health PT;Supervision/Assistance - 24 hour (initially)    Equipment Recommendations       Recommendations for Other Services       Precautions / Restrictions Precautions Precautions: Fall      Mobility  Bed Mobility Overal bed mobility: Needs Assistance Bed Mobility: Sidelying to Sit;Sit to Supine   Sidelying to sit: Min guard   Sit to supine: Supervision   General bed mobility comments: increased time and use of bedrails   Transfers Overall transfer level: Needs assistance Equipment used: None Transfers: Sit to/from [facility] Sit to Stand: Min guard Stand pivot transfers: Min guard       General transfer comment: min guard for safety   Ambulation/Gait Ambulation/Gait assistance: Min guard Gait Distance (Feet): 200 Feet   Gait Pattern/deviations: Step-through pattern Gait velocity: moderate Gait velocity interpretation: 1.31 - 2.62 ft/sec, indicative of limited community ambulator General Gait Details: mildly unsteady  and guarded gait, though no overt LOB.  Likely due to adducted "pidgeon toed" gait pattern.    Stairs            Wheelchair Mobility    Modified Rankin (Stroke Patients Only)       Balance Overall balance assessment: Mild deficits observed, not formally tested   Sitting balance-Leahy Scale: Fair       Standing balance-Leahy Scale: Fair                               Pertinent Vitals/Pain Pain Assessment: No/denies pain    Home Living Family/patient expects to be discharged to:: Private residence   Available Help at Discharge: Family Type of Home: House Home Access: Stairs to enter Entrance Stairs-Rails: Psychiatric nurse of Steps: 1 Home Layout: One level Home Equipment: Shower seat      Prior Function Level of Independence: Independent;Needs assistance      ADL's / Homemaking Assistance Needed: reports he needs some assistance intermittently with socks         Hand Dominance   Dominant Hand: Right    Extremity/Trunk Assessment   Upper Extremity Assessment Upper Extremity Assessment: RUE deficits/detail;LUE deficits/detail RUE Deficits / Details: pt with very little shoulder flexion bil. which he reports is baseline  LUE Deficits / Details: pt with very little shoulder flexion bil. which he reports is baseline     Lower Extremity Assessment Lower Extremity Assessment: RLE deficits/detail;LLE deficits/detail RLE Deficits / Details: general weakness, especially hip flexors RLE Sensation:  (LT WFL) LLE Deficits / Details: general weakness,  knee/leg pain LLE Sensation:  (LT WFL) LLE Coordination: decreased fine motor       Communication   Communication: Expressive difficulties  Cognition Arousal/Alertness: Awake/alert Behavior During Therapy: WFL for tasks assessed/performed Overall Cognitive Status: Impaired/Different from baseline Area of Impairment: Attention;Orientation;Problem solving                  Orientation Level: Disoriented to;Place Current Attention Level: Sustained;Selective         Problem Solving: Slow processing;Difficulty sequencing;Requires verbal cues General Comments: perseveration noted       General Comments General comments (skin integrity, edema, etc.): vss    Exercises     Assessment/Plan    PT Assessment Patient needs continued PT services  PT Problem List Decreased strength;Decreased activity tolerance;Decreased balance;Decreased mobility;Decreased coordination;Decreased cognition       PT Treatment Interventions Gait training;Stair training;Functional mobility training;Therapeutic activities;Balance training;Patient/family education    PT Goals (Current goals can be found in the Care Plan section)  Acute Rehab PT Goals Patient Stated Goal: to go home! PT Goal Formulation: With patient Time For Goal Achievement: 01/09/20 Potential to Achieve Goals: Good    Frequency Min 3X/week   Barriers to discharge        Co-evaluation PT/OT/SLP Co-Evaluation/Treatment: Yes Reason for Co-Treatment: To address functional/ADL transfers PT goals addressed during session: Mobility/safety with mobility         AM-PAC PT "6 Clicks" Mobility  Outcome Measure Help needed turning from your back to your side while in a flat bed without using bedrails?: A Little Help needed moving from lying on your back to sitting on the side of a flat bed without using bedrails?: A Little Help needed moving to and from a bed to a chair (including a wheelchair)?: A Little Help needed standing up from a chair using your arms (e.g., wheelchair or bedside chair)?: A Little Help needed to walk in hospital room?: A Little Help needed climbing 3-5 steps with a railing? : A Little 6 Click Score: 18    End of Session   Activity Tolerance: Patient tolerated treatment well Patient left: in bed Nurse Communication: Mobility status PT Visit Diagnosis: Unsteadiness on feet  (R26.81);Muscle weakness (generalized) (M62.81);Other abnormalities of gait and mobility (R26.89)    Time: 2130-8657 PT Time Calculation (min) (ACUTE ONLY): 23 min   Charges:   PT Evaluation $PT Eval Moderate Complexity: 1 Mod          01/02/2020  Ginger Carne., PT Acute Rehabilitation Services [phone]  (pager) [phone]  (office)  Tessie Fass Makeda Peeks 01/02/2020, 6:08 PM

## 2020-01-02 NOTE — Consult Note (Signed)
[facility] Nurse ostomy follow up Stoma type/location: LLQ colostomy Stomal assessment/size: 1" pink and moist, prolapsed Peristomal assessment: intact Treatment options for stomal/peristomal skin: barrier ring  Output soft brown stool. Ostomy pouching: 1pc.convex with barrier ring  Wears coloplast at home,  While here, will use [facility] # [phone] understands we use hollister in house.  Education provided: family at bedside.  Is agreeable to use pouch Enrolled patient in [facility] Start Discharge program: NA  Uses coloplast Will not follow at this time.  Please re-consult if needed.  Domenic Moras MSN, RN, FNP-BC CWON Wound, Ostomy, Continence Nurse Pager [phone]

## 2020-01-02 NOTE — Evaluation (Signed)
Speech Language Pathology Evaluation Patient Details Name: Lynton Crescenzo MRN: [id number] DOB: 1934/01/05 Today's Date: 01/02/2020 Time: 6213-0865 SLP Time Calculation (min) (ACUTE ONLY): 20 min  Problem List:  Patient Active Problem List   Diagnosis Date Noted  . Acute encephalopathy 01/01/2020  . Acute blood loss anemia 01/01/2020  . AKI (acute kidney injury) ([place]) 01/01/2020  . CAD (coronary artery disease) 01/01/2020  . Psoriasis 01/01/2020  . Post-ictal state ([place]) 01/01/2020   Past Medical History:  Past Medical History:  Diagnosis Date  . Cancer ([place])   . Coronary artery disease    Past Surgical History:  Past Surgical History:  Procedure Laterality Date  . COLON SURGERY    . CORONARY ARTERY BYPASS GRAFT     HPI:   Devarius Nelles is a 86 y.o. male who was found to be slightly confused with right-sided gaze preference by patient's wife.   CT head and CT angiogram of the head and neck did not show any large vessel obstruction or any acute findings.  Concern for status epilepticus patient was loaded with 2 g IV Keppra and continuous EEG was placed.  Patient admitted for acute encephalopathy likely postictal with anemia with blood loss and mild hypocalcemia.   Assessment / Plan / Recommendation Clinical Impression   Pt presents with moderately severe cognitive deficits.  Pt is oriented to place and situation but fluctuates on ability to recall today's date.  Pt and family both initially denied noticing any cognitive changes; however, when attempting to engage pt in a formal cognitive assessment, pt's family noticed novel deficits.  SLP administered portions of the SLUMS assessment; however, pt's mentation precluded its full administration as pt needed constant repetition of instructions and then would perseverate on previous instructions when transitioning between tasks.  Informally pt presents with decreased recall of daily information, decreased intellectual awareness of deficits,  decreased sustained attention to tasks, and decreased functional problem solving.  I am hopeful that once pt is more stable medically and with more time post seizure that his mentation will return to baseline.  Given the extent of the deficits mentioned above, pt would benefit from brief ST follow up while inpatient.  Discharge recommendations will be updated pending progress made while inpatient.      SLP Assessment  SLP Recommendation/Assessment: Patient needs continued Speech Lanaguage Pathology Services    Follow Up Recommendations  24 hour supervision/assistance    Frequency and Duration min 1 x/week         SLP Evaluation Cognition  Overall Cognitive Status: Impaired/Different from baseline Arousal/Alertness: Awake/alert Orientation Level: Oriented to person;Oriented to place;Disoriented to time;Oriented to situation Attention: Sustained Sustained Attention: Impaired Sustained Attention Impairment: Functional basic;Verbal basic Memory: Impaired Memory Impairment: Storage deficit;Retrieval deficit Awareness: Impaired Awareness Impairment: Intellectual impairment Problem Solving: Impaired Problem Solving Impairment: Functional basic;Verbal basic       Comprehension  Auditory Comprehension Overall Auditory Comprehension: Appears within functional limits for tasks assessed    Expression Expression Primary Mode of Expression: Verbal Verbal Expression Overall Verbal Expression: Appears within functional limits for tasks assessed   Oral / Motor  Oral Motor/Sensory Function Overall Oral Motor/Sensory Function: Within functional limits Motor Speech Overall Motor Speech: Appears within functional limits for tasks assessed   GO                    Tell Rozelle, Selinda Orion 01/02/2020, 2:10 PM

## 2020-01-02 NOTE — Progress Notes (Signed)
Subjective: No further seizures  Exam: Vitals:   01/02/20 1200 01/02/20 1646  BP: (!) 145/85 (!) 155/76  Pulse: 74 84  Resp: 16 17  Temp: 98.2 F (36.8 C)   SpO2: 100% 98%   Gen: In bed, NAD Resp: non-labored breathing, no acute distress Abd: soft, nt  Neuro: MS: Awake, alert, he appears to be improving, though still confused. CN: Pupils equal round and reactive, extraocular movements intact Motor: Moves all extremities well Sensory: Intact light touch   Impression: 86 year old male who presented with new onset seizure, his confusion is improving, I suspect some delirium.  If he continues to be encephalopathic tomorrow, may consider continuous EEG monitoring.  Recommendations: 1) continue Keppra XR 500 mg daily (renally dosed) 2) neurology will follow  Roland Rack, MD [facility] [phone]  If 7pm- 7am, please page neurology on call as listed in [place].

## 2020-01-02 NOTE — Evaluation (Signed)
Occupational Therapy Evaluation Patient Details Name: Reginald Todd MRN: [id number] DOB: 06-Oct-1933 Today's Date: 01/02/2020    History of Present Illness This 86 y.o. male admitted with AMS and Rt sided gaze preference. CTA showed no LVO.  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  Dx:  possible seizure and acute encephalopathy.  PMH includes: CAD, s/p CABG, psoriasis on Methotrexate, s/p colon CA with hemicolectomy - last chemo 10/2019   Clinical Impression   Pt admitted with above. He demonstrates the below listed deficits and will benefit from continued OT to maximize safety and independence with BADLs.  Pt presents to OT with mild balance deficits, as well as impaired cognition, and decreased activity tolerance.  He currently requires supervision - min A for ADLs and min guard assist for functional mobility.  He reports he lives with his wife and daughter, who are supportive and who can provide necessary level of assist at discharge.  Recommend HHOT and 24 hour supervision initially       Follow Up Recommendations  Home health OT;Supervision/Assistance - 24 hour    Equipment Recommendations  3 in 1 bedside commode    Recommendations for Other Services       Precautions / Restrictions Precautions Precautions: Fall      Mobility Bed Mobility Overal bed mobility: Needs Assistance Bed Mobility: Sidelying to Sit;Sit to Supine   Sidelying to sit: Min guard   Sit to supine: Supervision   General bed mobility comments: increased time and use of bedrails   Transfers Overall transfer level: Needs assistance Equipment used: None Transfers: Sit to/from [facility] Sit to Stand: Min guard Stand pivot transfers: Min guard       General transfer comment: min guard for safety     Balance                                           ADL either performed or assessed with  clinical judgement   ADL Overall ADL's : Needs assistance/impaired Eating/Feeding: Set up;Supervision/ safety;Bed level   Grooming: Wash/dry hands;Wash/dry face;Oral care;Minimal assistance;Standing   Upper Body Bathing: Supervision/ safety;Sitting   Lower Body Bathing: Minimal assistance;Sit to/from stand   Upper Body Dressing : Set up;Supervision/safety;Sitting   Lower Body Dressing: Minimal assistance;Sit to/from stand   Toilet Transfer: Min guard;Ambulation;Comfort height toilet   Toileting- Clothing Manipulation and Hygiene: Min guard;Sit to/from stand       Functional mobility during ADLs: Min guard       Vision   Vision Assessment?: No apparent visual deficits Additional Comments: Pt reports he is supposed to wear glasses, but doesn't.  He denies visual changes.       Perception Perception Perception Tested?: Yes   Praxis Praxis Praxis tested?: Within functional limits    Pertinent Vitals/Pain Pain Assessment: No/denies pain     Hand Dominance Right   Extremity/Trunk Assessment Upper Extremity Assessment Upper Extremity Assessment: RUE deficits/detail;LUE deficits/detail RUE Deficits / Details: pt with very little shoulder flexion bil. which he reports is baseline  LUE Deficits / Details: pt with very little shoulder flexion bil. which he reports is baseline    Lower Extremity Assessment Lower Extremity Assessment: Defer to PT evaluation       Communication Communication Communication: Expressive difficulties   Cognition Arousal/Alertness: Awake/alert Behavior During Therapy: WFL for tasks assessed/performed Overall  Cognitive Status: Impaired/Different from baseline Area of Impairment: Attention;Orientation;Problem solving                 Orientation Level: Disoriented to;Place Current Attention Level: Sustained;Selective         Problem Solving: Slow processing;Difficulty sequencing;Requires verbal cues General Comments: perseveration  noted    General Comments  VSS     Exercises     Shoulder Instructions      Home Living Family/patient expects to be discharged to:: Private residence   Available Help at Discharge: Family Type of Home: House Home Access: Stairs to enter Technical brewer of Steps: 1 Entrance Stairs-Rails: Right;Left Home Layout: One level     Bathroom Shower/Tub: Occupational psychologist: Standard     Home Equipment: Civil engineer, contracting      Lives With: Spouse    Prior Functioning/Environment Level of Independence: Independent    ADL's / Homemaking Assistance Needed: reports he needs some assistance intermittently with socks             OT Problem List: Decreased range of motion;Decreased activity tolerance;Impaired balance (sitting and/or standing);Decreased cognition;Decreased safety awareness;Decreased knowledge of use of DME or AE      OT Treatment/Interventions: Self-care/ADL training;DME and/or AE instruction;Therapeutic activities;Cognitive remediation/compensation;Patient/family education;Balance training    OT Goals(Current goals can be found in the care plan section) Acute Rehab OT Goals Patient Stated Goal: to go home! OT Goal Formulation: With patient Time For Goal Achievement: 01/09/20 Potential to Achieve Goals: Good ADL Goals Pt Will Perform Grooming: with supervision;standing Pt Will Perform Upper Body Bathing: with set-up;sitting Pt Will Perform Lower Body Bathing: with supervision;sit to/from stand Pt Will Perform Upper Body Dressing: with supervision;sitting Pt Will Perform Lower Body Dressing: with supervision;sit to/from stand Pt Will Transfer to Toilet: with supervision;ambulating;bedside commode Pt Will Perform Toileting - Clothing Manipulation and hygiene: with supervision;sit to/from stand  OT Frequency: Min 2X/week   Barriers to D/C:            Co-evaluation              AM-PAC OT "6 Clicks" Daily Activity     Outcome Measure  Help from another person eating meals?: A Little Help from another person taking care of personal grooming?: A Little Help from another person toileting, which includes using toliet, bedpan, or urinal?: A Little Help from another person bathing (including washing, rinsing, drying)?: A Little Help from another person to put on and taking off regular upper body clothing?: A Little Help from another person to put on and taking off regular lower body clothing?: A Little 6 Click Score: 18   End of Session Nurse Communication: Mobility status  Activity Tolerance: Patient tolerated treatment well Patient left: in bed;with call bell/phone within reach;with bed alarm set  OT Visit Diagnosis: Unsteadiness on feet (R26.81);Cognitive communication deficit (R41.841)                Time: 9030-0923 OT Time Calculation (min): 23 min Charges:  OT General Charges $OT Visit: 1 Visit OT Evaluation $OT Eval Moderate Complexity: 1 Mod  Nilsa Nutting., OTR/L Acute Rehabilitation Services Pager [phone] Office [phone]   Lucille Passy M 01/02/2020, 5:48 PM

## 2020-01-02 NOTE — Progress Notes (Signed)
OT Cancellation Note  Patient Details Name: Reginald Todd MRN: [id number] DOB: 1933/08/04   Cancelled Treatment:    Reason Eval/Treat Not Completed: Medical issues which prohibited therapy.  Last Hbg 6.6 on 8/19.  Will check back and initiate OT once medically stable.   Nilsa Nutting., OTR/L Acute Rehabilitation Services Pager [phone] Office [phone]   Lucille Passy M 01/02/2020, 10:59 AM

## 2020-01-03 ENCOUNTER — Inpatient Hospital Stay (HOSPITAL_COMMUNITY): Payer: Medicare HMO

## 2020-01-03 DIAGNOSIS — N184 Chronic kidney disease, stage 4 (severe): Secondary | ICD-10-CM

## 2020-01-03 DIAGNOSIS — L409 Psoriasis, unspecified: Secondary | ICD-10-CM

## 2020-01-03 DIAGNOSIS — I2510 Atherosclerotic heart disease of native coronary artery without angina pectoris: Secondary | ICD-10-CM

## 2020-01-03 LAB — CBC WITH DIFFERENTIAL/PLATELET
Abs Immature Granulocytes: 0.02 10*3/uL (ref 0.00–0.07)
Basophils Absolute: 0.1 10*3/uL (ref 0.0–0.1)
Basophils Relative: 1 %
Eosinophils Absolute: 0.1 10*3/uL (ref 0.0–0.5)
Eosinophils Relative: 2 %
HCT: 25 % — ABNORMAL LOW (ref 39.0–52.0)
Hemoglobin: 8 g/dL — ABNORMAL LOW (ref 13.0–17.0)
Immature Granulocytes: 0 %
Lymphocytes Relative: 18 %
Lymphs Abs: 1.3 10*3/uL (ref 0.7–4.0)
MCH: 28.3 pg (ref 26.0–34.0)
MCHC: 32 g/dL (ref 30.0–36.0)
MCV: 88.3 fL (ref 80.0–100.0)
Monocytes Absolute: 0.9 10*3/uL (ref 0.1–1.0)
Monocytes Relative: 12 %
Neutro Abs: 4.8 10*3/uL (ref 1.7–7.7)
Neutrophils Relative %: 67 %
Platelets: 184 10*3/uL (ref 150–400)
RBC: 2.83 MIL/uL — ABNORMAL LOW (ref 4.22–5.81)
RDW: 15 % (ref 11.5–15.5)
WBC: 7.3 10*3/uL (ref 4.0–10.5)
nRBC: 0 % (ref 0.0–0.2)

## 2020-01-03 LAB — COMPREHENSIVE METABOLIC PANEL
ALT: 9 U/L (ref 0–44)
AST: 16 U/L (ref 15–41)
Albumin: 2 g/dL — ABNORMAL LOW (ref 3.5–5.0)
Alkaline Phosphatase: 85 U/L (ref 38–126)
Anion gap: 10 (ref 5–15)
BUN: 22 mg/dL (ref 8–23)
CO2: 25 mmol/L (ref 22–32)
Calcium: 6.9 mg/dL — ABNORMAL LOW (ref 8.9–10.3)
Chloride: 104 mmol/L (ref 98–111)
Creatinine, Ser: 2.74 mg/dL — ABNORMAL HIGH (ref 0.61–1.24)
GFR calc Af Amer: 24 mL/min — ABNORMAL LOW (ref >60)
GFR calc non Af Amer: 20 mL/min — ABNORMAL LOW (ref >60)
Glucose, Bld: 132 mg/dL — ABNORMAL HIGH (ref 70–99)
Potassium: 3.4 mmol/L — ABNORMAL LOW (ref 3.5–5.1)
Sodium: 139 mmol/L (ref 135–145)
Total Bilirubin: 0.4 mg/dL (ref 0.3–1.2)
Total Protein: 5.5 g/dL — ABNORMAL LOW (ref 6.5–8.1)

## 2020-01-03 LAB — GLUCOSE, CAPILLARY
Glucose-Capillary: 102 mg/dL — ABNORMAL HIGH (ref 70–99)
Glucose-Capillary: 102 mg/dL — ABNORMAL HIGH (ref 70–99)
Glucose-Capillary: 105 mg/dL — ABNORMAL HIGH (ref 70–99)
Glucose-Capillary: 76 mg/dL (ref 70–99)
Glucose-Capillary: 84 mg/dL (ref 70–99)

## 2020-01-03 LAB — MAGNESIUM: Magnesium: 1 mg/dL — ABNORMAL LOW (ref 1.7–2.4)

## 2020-01-03 LAB — PHOSPHORUS: Phosphorus: 2.9 mg/dL (ref 2.5–4.6)

## 2020-01-03 MED ORDER — HYDRALAZINE HCL 20 MG/ML IJ SOLN: 10.0000 mg | INTRAMUSCULAR | Status: DC | PRN

## 2020-01-03 MED ORDER — MAGNESIUM SULFATE 50 % IJ SOLN
3.0000 g | Freq: Once | INTRAVENOUS | Status: AC
  Administered 2020-01-03: 3 g via INTRAVENOUS
  Filled 2020-01-03: qty 6

## 2020-01-03 MED ORDER — POTASSIUM CHLORIDE CRYS ER 20 MEQ PO TBCR
50.0000 meq | EXTENDED_RELEASE_TABLET | Freq: Once | ORAL | Status: AC
  Administered 2020-01-03: 50 meq via ORAL
  Filled 2020-01-03: qty 1
  Filled 2020-01-03: qty 2

## 2020-01-03 MED ORDER — CARVEDILOL 6.25 MG PO TABS: 6.2500 mg | ORAL_TABLET | Freq: Two times a day (BID) | ORAL | Status: DC

## 2020-01-03 MED ORDER — CARVEDILOL 6.25 MG PO TABS
6.2500 mg | ORAL_TABLET | Freq: Two times a day (BID) | ORAL | Status: DC
  Administered 2020-01-03 – 2020-01-05 (×5): 6.25 mg via ORAL
  Filled 2020-01-03 (×5): qty 1

## 2020-01-03 NOTE — Progress Notes (Signed)
PROGRESS NOTE    Reginald Todd  FBP:[id number] DOB: [birth year] DOA: 01/01/2020 PCP: Rochel Brome, MD     Brief Narrative:  86 y.o. BM PMHx colon cancer S/P Hemicolectomy with colostomy last chemotherapy in June 2021 at [facility] with history of CAD status post CABG, HTN, Psoriasis on methotrexate   Found to be slightly confused with right-sided gaze preference by patient's wife at around 10:00 last night.  Patient's daughter talked to the patient around 8 PM when patient was appearing normal.  Patient did not have any fever chills or any change in medications recently.  Per daughter patient use to drink a lot of alcohol prior to the colon cancer diagnosis and recently last 2 years has been drinking once a week.  ED Course: In the ER patient was evaluated by the neurologist.  CT head and CT angiogram of the head and neck did not show any large vessel obstruction or any acute findings.  Concern for status epilepticus patient was loaded with 2 g IV Keppra and continuous EEG was placed.  At the time of my exam patient is more alert but still confused repeating my questions.  Labs are significant for corrected calcium was 7.4 hemoglobin of 6.6 with colostomy bag showing liquid stools which are brown in color but stool for occult blood positive.  Creatinine is 2.9 the last 1 available in care everywhere instead in 2017 was around 1.5 with GFR of around 40%.  Bicarb is 17 now with albumin of 2.3.  WBC 11.1 chest x-ray is pending UA is unremarkable.  Patient admitted for acute encephalopathy likely postictal with anemia with blood loss and mild hypocalcemia.   Subjective: Afebrile last 24 hours A/O x4, negative CP, negative S OB, negative abdominal pain.   Assessment & Plan: Covid vaccination; positive vaccination   Principal Problem:   Acute encephalopathy Active Problems:   Acute blood loss anemia   AKI (acute kidney injury) ([place])   CAD (coronary artery disease)   Psoriasis    Post-ictal state ([place])   Acute Encephalopathy -Resolved -MRI; no acute findings see results below -8/21, 24-hour EEG per neurology -Keppra 500 mg daily -We will await further recommendations from neurology  Acute blood loss anemia with Hx colon cancer -Last chemotherapy June 2021 -Fecal occult positive -8/19 transfuse 1 unit PRBC -Trend H/H  Ref Range & Units 08:50  (01/03/20) 1 d ago  (01/02/20) 2 d ago  (01/01/20) 2 d ago  (01/01/20)  WBC 4.0 - 10.5 K/uL 7.3  8.1  11.1High    RBC 4.22 - 5.81 MIL/uL 2.83Low  2.72Low  2.38Low    Hemoglobin 13.0 - 17.0 g/dL 8.0Low  7.5Low  6.6Low Panic CM  10.5Low   -Stable -Transfuse for hemoglobin<7   CAD status post CABG: Continue current home medications once he was able to take p.o.'s.  Essential HTN -8/21 Coreg 6.25 mg BID -Hydralazine IV PRN  Psoriatic arthritis: -Supposedly on methotrexate but not on MAR will discuss with patient in a.m.  CKD stage stage IV?  (Appears baseline Cr 2.74) Lab Results  Component Value Date   CREATININE 2.74 (H) 01/03/2020   CREATININE 2.82 (H) 01/02/2020   CREATININE 2.92 (H) 01/01/2020  -Monitor closely -Hold all nephrotoxic medication -  Hypokalemia -Potassium goal> 4 -K-Dur 50 mEq  Hypomagnesmia -Magnesium goal> 2 -Magnesium IV 3 g   DVT prophylaxis: SCD Code Status: Full Family Communication:  Status is: Inpatient    Dispo: The patient is from: Home  Anticipated d/c is to: Home              Anticipated d/c date is: 8/24              Patient currently unstable      Consultants:  Neurology  Procedures/Significant Events:  8/19 MRI brain W. Wo contrast;No acute intracranial abnormality. 2. 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 3. Moderate for age signal changes in the cerebral white matter, basal ganglia, and pons, most commonly due to chronic small vessel disease.  I have personally reviewed and  interpreted all radiology studies and my findings are as above.  VENTILATOR SETTINGS:    Cultures 8/19 SARS coronavirus negative   Antimicrobials:     Devices    LINES / TUBES:      Continuous Infusions:   Objective: Vitals:   01/02/20 1646 01/02/20 2036 01/02/20 2357 01/03/20 0315  BP: (!) 155/76 (!) 164/89 (!) 176/94 (!) 165/95  Pulse: 84 88 88 79  Resp: 17 (!) 22 20 18   Temp:  100.3 F (37.9 C) 100 F (37.8 C) 99.4 F (37.4 C)  TempSrc:  Oral Axillary Axillary  SpO2: 98% 97% 100% 90%  Weight:      Height:        Intake/Output Summary (Last 24 hours) at 01/03/2020 0109 Last data filed at 01/03/2020 0304 Gross per 24 hour  Intake 1405.27 ml  Output 250 ml  Net 1155.27 ml   Filed Weights   01/01/20 0529 01/01/20 2157  Weight: 61.3 kg 60.6 kg    Examination:  General: A/O x4, No acute respiratory distress Eyes: negative scleral hemorrhage, negative anisocoria, negative icterus ENT: Negative Runny nose, negative gingival bleeding, Neck:  Negative scars, masses, torticollis, lymphadenopathy, JVD Lungs: Clear to auscultation bilaterally without wheezes or crackles Cardiovascular: Regular rate and rhythm without murmur gallop or rub normal S1 and S2 Abdomen: negative abdominal pain, nondistended, positive soft, bowel sounds, no rebound, no ascites, no appreciable mass Extremities: No significant cyanosis, clubbing, or edema bilateral lower extremities Skin: Negative rashes, lesions, ulcers Psychiatric:  Negative depression, negative anxiety, negative fatigue, negative mania  Central nervous system:  Cranial nerves II through XII intact, tongue/uvula midline, all extremities muscle strength 5/5, sensation intact throughout, negative dysarthria, negative expressive aphasia, negative receptive aphasia.  .     Data Reviewed: Care during the described time interval was provided by me .  I have reviewed this patient's available data, including medical  history, events of note, physical examination, and all test results as part of my evaluation.  CBC: Recent Labs  Lab 01/01/20 0107 01/01/20 0116 01/02/20 1158  WBC  --  11.1* 8.1  NEUTROABS  --  7.5  --   HGB 10.5* 6.6* 7.5*  HCT 31.0* 22.9* 24.0*  MCV  --  96.2 88.2  PLT  --  230 323   Basic Metabolic Panel: Recent Labs  Lab 01/01/20 0107 01/01/20 0116 01/02/20 1158  NA 140 140 139  K 3.8 4.0 3.5  CL 103 105 103  CO2  --  17* 24  GLUCOSE 108* 110* 127*  BUN 29* 27* 27*  CREATININE 3.20* 2.92* 2.82*  CALCIUM  --  6.4* 6.8*   GFR: Estimated Creatinine Clearance: 17 mL/min (A) (by C-G formula based on SCr of 2.82 mg/dL (H)). Liver Function Tests: Recent Labs  Lab 01/01/20 0116  AST 20  ALT 11  ALKPHOS 79  BILITOT 0.4  PROT 5.7*  ALBUMIN 2.3*  No results for input(s): LIPASE, AMYLASE in the last 168 hours. No results for input(s): AMMONIA in the last 168 hours. Coagulation Profile: Recent Labs  Lab 01/01/20 0149  INR 1.1   Cardiac Enzymes: No results for input(s): CKTOTAL, CKMB, CKMBINDEX, TROPONINI in the last 168 hours. BNP (last 3 results) No results for input(s): PROBNP in the last 8760 hours. HbA1C: No results for input(s): HGBA1C in the last 72 hours. CBG: Recent Labs  Lab 01/02/20 0620 01/02/20 1203 01/02/20 1848 01/03/20 0004 01/03/20 0611  GLUCAP 96 122* 103* 102* 102*   Lipid Profile: No results for input(s): CHOL, HDL, LDLCALC, TRIG, CHOLHDL, LDLDIRECT in the last 72 hours. Thyroid Function Tests: No results for input(s): TSH, T4TOTAL, FREET4, T3FREE, THYROIDAB in the last 72 hours. Anemia Panel: No results for input(s): VITAMINB12, FOLATE, FERRITIN, TIBC, IRON, RETICCTPCT in the last 72 hours. Sepsis Labs: No results for input(s): PROCALCITON, LATICACIDVEN in the last 168 hours.  Recent Results (from the past 240 hour(s))  SARS Coronavirus 2 by RT PCR (hospital order, performed in [facility] hospital lab) Nasopharyngeal  Nasopharyngeal Swab     Status: None   Collection Time: 01/01/20  6:12 AM   Specimen: Nasopharyngeal Swab  Result Value Ref Range Status   SARS Coronavirus 2 NEGATIVE NEGATIVE Final    Comment: (NOTE) SARS-CoV-2 target nucleic acids are NOT DETECTED.  The SARS-CoV-2 RNA is generally detectable in upper and lower respiratory specimens during the acute phase of infection. The lowest concentration of SARS-CoV-2 viral copies this assay can detect is 250 copies / mL. A negative result does not preclude SARS-CoV-2 infection and should not be used as the sole basis for treatment or other patient management decisions.  A negative result may occur with improper specimen collection / handling, submission of specimen other than nasopharyngeal swab, presence of viral mutation(s) within the areas targeted by this assay, and inadequate number of viral copies (<250 copies / mL). A negative result must be combined with clinical observations, patient history, and epidemiological information.  Fact Sheet for Patients:   StrictlyIdeas.no  Fact Sheet for Healthcare Providers: BankingDealers.co.za  This test is not yet approved or  cleared by the [place] FDA and has been authorized for detection and/or diagnosis of SARS-CoV-2 by FDA under an Emergency Use Authorization (EUA).  This EUA will remain in effect (meaning this test can be used) for the duration of the COVID-19 declaration under Section 564(b)(1) of the Act, 21 U.S.C. section 360bbb-3(b)(1), unless the authorization is terminated or revoked sooner.  Performed at [facility], [place] [address] [place], [place] [address]          Radiology Studies: EEG  Result Date: 01/01/2020 Lora Havens, MD     01/01/2020 11:06 AM Patient Name: Codi Kertz MRN: [id number] Epilepsy Attending: Lora Havens Referring Physician/Provider: Dr. Donnetta Simpers Date: 8/90/2021 Duration:  24.39 minutes Patient history: 86 year old male presented with altered mental status and right gaze deviation as well as seizure-like activity.  EEG evaluate for seizures. Level of alertness: lethargic AEDs during EEG study: LEV Technical aspects: This EEG study was done with scalp electrodes positioned according to the 10-20 International system of electrode placement. Electrical activity was acquired at a sampling rate of 500Hz  and reviewed with a high frequency filter of 70Hz  and a low frequency filter of 1Hz . EEG data were recorded continuously and digitally stored. Description: No clear posterior dominant was seen.  EEG showed continuous generalized 3 to 5 Hz theta-delta slowing.  Hyperventilation and photic stimulation were not performed.   ABNORMALITY -Continuous slow, generalized IMPRESSION: This study is suggestive ofmoderate diffuse encephalopathy, nonspecific etiology. No seizures or definite epileptiform discharges were seen throughout the recording. Lora Havens   MR BRAIN WO CONTRAST  Result Date: 01/01/2020 CLINICAL DATA:  86 year old male with history of colon cancer status post chemotherapy in June. Confusion with right side gaze preference 2200 hours yesterday. EXAM: MRI HEAD WITHOUT CONTRAST TECHNIQUE: Multiplanar, multiecho pulse sequences of the brain and surrounding structures were obtained without intravenous contrast. COMPARISON:  CT head and CTA head and neck earlier today. FINDINGS: Brain: No restricted diffusion or evidence of acute infarction. 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 Small area of chronic cortical encephalomalacia in the left lateral occipital lobe. Occasional chronic micro hemorrhages, including in the right posterior lentiform on series 8, image 37. Hippocampal formations and mesial temporal lobes appear symmetric and within normal limits for age. Patchy and confluent bilateral cerebral white matter T2 and FLAIR hyperintensity. T2 heterogeneity in the  bilateral basal ganglia appears more related to perivascular spaces. Mild T2 heterogeneity in the pons. No midline shift, mass effect, evidence of mass lesion, ventriculomegaly, extra-axial collection or acute intracranial hemorrhage. Cervicomedullary junction and pituitary are within normal limits. Vascular: Major intracranial vascular flow voids are preserved. Skull and upper cervical spine: Negative for age visible cervical spine, bone marrow signal. Sinuses/Orbits: Postoperative changes to both globes. Otherwise negative orbits. Trace paranasal sinus mucosal thickening. Other: Mastoids are clear. Visible internal auditory structures appear normal. Trace retained secretions in the nasopharynx. IMPRESSION: 1. No acute intracranial abnormality. 2. 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 3. Moderate for age signal changes in the cerebral white matter, basal ganglia, and pons, most commonly due to chronic small vessel disease. Electronically Signed   By: Genevie Ann M.D.   On: 01/01/2020 12:33        Scheduled Meds: . sodium chloride   Intravenous Once  . Chlorhexidine Gluconate Cloth  6 each Topical Daily  . levETIRAcetam  500 mg Oral Daily   Continuous Infusions:   LOS: 2 days    Time spent:40 min    Lynnleigh Soden, Geraldo Docker, MD [facility] Pager [phone]  If 7PM-7AM, please contact night-coverage [email or web address] Password [facility] 01/03/2020, 8:07 AM

## 2020-01-03 NOTE — Progress Notes (Signed)
Subjective: No further seizures, he continues to be confused.  Nurse states that he will be seen in all right for a while, and then suddenly go off on a tangent.  Exam: Vitals:   01/03/20 1237 01/03/20 1607  BP: (!) 169/94 (!) 169/103  Pulse: 84 87  Resp: 18 (!) 23  Temp: 98.5 F (36.9 C) 98.4 F (36.9 C)  SpO2: 98% 95%   Gen: In bed, NAD Resp: non-labored breathing, no acute distress Abd: soft, nt  Neuro: MS: Awake, alert, he appears to be improving, though still confused. CN: Pupils equal round and reactive, extraocular movements intact Motor: Moves all extremities well Sensory: Intact light touch   Impression: 86 year old male who presented with new onset seizure, his confusion is improving, I suspect some delirium.  With continued confusion in the fact that he may have some waxing/waning, I would favor getting an EEG to rule out nonconvulsive seizures.  Recommendations: 1) continue Keppra XR 500 mg daily (renally dosed) 2) overnight EEG 3) neurology will continue to follow  Roland Rack, MD [facility] [phone]  If 7pm- 7am, please page neurology on call as listed in [place].

## 2020-01-03 NOTE — Progress Notes (Signed)
vLTM started  [facility] to monitor    Neurology notified.

## 2020-01-04 LAB — CBC WITH DIFFERENTIAL/PLATELET
Abs Immature Granulocytes: 0.02 10*3/uL (ref 0.00–0.07)
Basophils Absolute: 0.1 10*3/uL (ref 0.0–0.1)
Basophils Relative: 1 %
Eosinophils Absolute: 0.2 10*3/uL (ref 0.0–0.5)
Eosinophils Relative: 3 %
HCT: 26 % — ABNORMAL LOW (ref 39.0–52.0)
Hemoglobin: 8.2 g/dL — ABNORMAL LOW (ref 13.0–17.0)
Immature Granulocytes: 0 %
Lymphocytes Relative: 23 %
Lymphs Abs: 1.6 10*3/uL (ref 0.7–4.0)
MCH: 28 pg (ref 26.0–34.0)
MCHC: 31.5 g/dL (ref 30.0–36.0)
MCV: 88.7 fL (ref 80.0–100.0)
Monocytes Absolute: 0.9 10*3/uL (ref 0.1–1.0)
Monocytes Relative: 13 %
Neutro Abs: 4.2 10*3/uL (ref 1.7–7.7)
Neutrophils Relative %: 60 %
Platelets: 179 10*3/uL (ref 150–400)
RBC: 2.93 MIL/uL — ABNORMAL LOW (ref 4.22–5.81)
RDW: 14.6 % (ref 11.5–15.5)
WBC: 6.9 10*3/uL (ref 4.0–10.5)
nRBC: 0 % (ref 0.0–0.2)

## 2020-01-04 LAB — GLUCOSE, CAPILLARY
Glucose-Capillary: 97 mg/dL (ref 70–99)
Glucose-Capillary: 107 mg/dL — ABNORMAL HIGH (ref 70–99)
Glucose-Capillary: 105 mg/dL — ABNORMAL HIGH (ref 70–99)

## 2020-01-04 LAB — COMPREHENSIVE METABOLIC PANEL
ALT: 20 U/L (ref 0–44)
AST: 15 U/L (ref 15–41)
Albumin: 1.9 g/dL — ABNORMAL LOW (ref 3.5–5.0)
Alkaline Phosphatase: 85 U/L (ref 38–126)
Anion gap: 8 (ref 5–15)
BUN: 25 mg/dL — ABNORMAL HIGH (ref 8–23)
CO2: 24 mmol/L (ref 22–32)
Calcium: 7.1 mg/dL — ABNORMAL LOW (ref 8.9–10.3)
Chloride: 104 mmol/L (ref 98–111)
Creatinine, Ser: 2.73 mg/dL — ABNORMAL HIGH (ref 0.61–1.24)
GFR calc Af Amer: 24 mL/min — ABNORMAL LOW (ref >60)
GFR calc non Af Amer: 21 mL/min — ABNORMAL LOW (ref >60)
Glucose, Bld: 102 mg/dL — ABNORMAL HIGH (ref 70–99)
Potassium: 4.6 mmol/L (ref 3.5–5.1)
Sodium: 136 mmol/L (ref 135–145)
Total Bilirubin: 0.5 mg/dL (ref 0.3–1.2)
Total Protein: 5.3 g/dL — ABNORMAL LOW (ref 6.5–8.1)

## 2020-01-04 LAB — PHOSPHORUS: Phosphorus: 3.3 mg/dL (ref 2.5–4.6)

## 2020-01-04 LAB — MAGNESIUM: Magnesium: 1.7 mg/dL (ref 1.7–2.4)

## 2020-01-04 MED ORDER — MAGNESIUM SULFATE 2 GM/50ML IV SOLN
2.0000 g | Freq: Once | INTRAVENOUS | Status: AC
  Administered 2020-01-04: 2 g via INTRAVENOUS
  Filled 2020-01-04: qty 50

## 2020-01-04 MED ORDER — SODIUM CHLORIDE 0.9% FLUSH
10.0000 mL | Freq: Two times a day (BID) | INTRAVENOUS | Status: DC
  Administered 2020-01-04 – 2020-01-05 (×2): 10 mL

## 2020-01-04 MED ORDER — SODIUM CHLORIDE 0.9% FLUSH
10.0000 mL | INTRAVENOUS | Status: DC | PRN
  Administered 2020-01-05: 10 mL

## 2020-01-04 NOTE — Plan of Care (Signed)
Patient stable, discussed POC with patient, agreeable with plan, denies question/concerns at this time.   Problem: Education: Goal: Knowledge of General Education information will improve Description: Including pain rating scale, medication(s)/side effects and non-pharmacologic comfort measures Outcome: Progressing   Problem: Health Behavior/Discharge Planning: Goal: Ability to manage health-related needs will improve Outcome: Progressing   Problem: Clinical Measurements: Goal: Ability to maintain clinical measurements within normal limits will improve Outcome: Progressing Goal: Will remain Harmani Neto from infection Outcome: Progressing Goal: Diagnostic test results will improve Outcome: Progressing Goal: Respiratory complications will improve Outcome: Progressing Goal: Cardiovascular complication will be avoided Outcome: Progressing   Problem: Activity: Goal: Risk for activity intolerance will decrease Outcome: Progressing   Problem: Nutrition: Goal: Adequate nutrition will be maintained Outcome: Progressing   Problem: Coping: Goal: Level of anxiety will decrease Outcome: Progressing   Problem: Elimination: Goal: Will not experience complications related to bowel motility Outcome: Progressing Goal: Will not experience complications related to urinary retention Outcome: Progressing   Problem: Pain Managment: Goal: General experience of comfort will improve Outcome: Progressing   Problem: Safety: Goal: Ability to remain Sreeja Spies from injury will improve Outcome: Progressing   Problem: Skin Integrity: Goal: Risk for impaired skin integrity will decrease Outcome: Progressing

## 2020-01-04 NOTE — Procedures (Addendum)
Patient Name: Chaitanya Amedee  MRN: [id number]  Epilepsy Attending: Lora Havens  Referring Physician/Provider: Dr. Roland Rack Duration: 01/03/2020 9629 to 01/04/2020 0947  Patient history: 86 year old male presented with altered mental status and right gaze deviation as well as seizure-like activity.  EEG evaluate for seizures.  Level of alertness: lethargic  AEDs during EEG study: LEV  Technical aspects: This EEG study was done with scalp electrodes positioned according to the 10-20 International system of electrode placement. Electrical activity was acquired at a sampling rate of 500Hz  and reviewed with a high frequency filter of 70Hz  and a low frequency filter of 1Hz . EEG data were recorded continuously and digitally stored.   Description:  The posterior dominant rhythm consists of 8 Hz activity of moderate voltage (25-35 uV) seen predominantly in posterior head regions, symmetric and reactive to eye opening and eye closing. Sleep was characterized by vertex waves, sleep spindles (12 to 14 Hz), maximal frontocentral region.   EEG showed intermittent generalized rhythmic high amplitude 2-3hz  delta slowing. Hyperventilation and photic stimulation were not performed.    ABNORMALITY -Intermittent rhythmic slow, generalized  IMPRESSION: This study is suggestive of mild diffuse encephalopathy, nonspecific etiology. No seizures or definite epileptiform discharges were seen throughout the recording.  Marc Leichter Barbra Sarks

## 2020-01-04 NOTE — Progress Notes (Signed)
Subjective: No further seizures, he continues to be confused.  Nurse states that he will be seen in all right for a while, and then suddenly go off on a tangent.  Exam: Vitals:   01/04/20 1640 01/04/20 1929  BP: (!) 139/96 (!) 149/98  Pulse: 81 80  Resp: (!) 25 20  Temp: 98 F (36.7 C) 98.8 F (37.1 C)  SpO2: 100% 98%   Gen: In bed, NAD Resp: non-labored breathing, no acute distress Abd: soft, nt  Neuro: MS: Awake, alert, he continues to appear to be improving, oriented x 3 today.  CN: Pupils equal round and reactive, extraocular movements intact Motor: Moves all extremities well Sensory: Intact light touch   Impression: 86 year old male who presented with new onset seizure, his confusion is improving, I suspect some delirium.  His EEG is negative. At this poitn, I thin kthat care will be supportive.   Recommendations: 1) continue Keppra XR 500 mg daily (renally dosed) 2) discontinue EEG 3) neurology will sign off at this time. Please call with further questions or concerns.   Roland Rack, MD [facility] [phone]  If 7pm- 7am, please page neurology on call as listed in [place].

## 2020-01-04 NOTE — Progress Notes (Signed)
LTM maintenance   Event button tested,  No skin breakdown noted at FP1  FP2  F7  F8   All impedances below 5kohms

## 2020-01-04 NOTE — Progress Notes (Signed)
PROGRESS NOTE    Reginald Todd  QQV:[id number] DOB: 11-19-1933 DOA: 01/01/2020 PCP: Rochel Brome, MD     Brief Narrative:  86 y.o. BM PMHx colon cancer S/P Hemicolectomy with colostomy last chemotherapy in June 2021 at [facility] with history of CAD status post CABG, HTN, Psoriasis on methotrexate   Found to be slightly confused with right-sided gaze preference by patient's wife at around 10:00 last night.  Patient's daughter talked to the patient around 8 PM when patient was appearing normal.  Patient did not have any fever chills or any change in medications recently.  Per daughter patient use to drink a lot of alcohol prior to the colon cancer diagnosis and recently last 2 years has been drinking once a week.  ED Course: In the ER patient was evaluated by the neurologist.  CT head and CT angiogram of the head and neck did not show any large vessel obstruction or any acute findings.  Concern for status epilepticus patient was loaded with 2 g IV Keppra and continuous EEG was placed.  At the time of my exam patient is more alert but still confused repeating my questions.  Labs are significant for corrected calcium was 7.4 hemoglobin of 6.6 with colostomy bag showing liquid stools which are brown in color but stool for occult blood positive.  Creatinine is 2.9 the last 1 available in care everywhere instead in 2017 was around 1.5 with GFR of around 40%.  Bicarb is 17 now with albumin of 2.3.  WBC 11.1 chest x-ray is pending UA is unremarkable.  Patient admitted for acute encephalopathy likely postictal with anemia with blood loss and mild hypocalcemia.   Subjective: 8/22 afebrile last 24 hours, A/O x4.  Negative CP, negative S OB, negative abdominal pain.  Waiting for patient's 24-hour EEG to be complete   Assessment & Plan: Covid vaccination; positive vaccination   Principal Problem:   Acute encephalopathy Active Problems:   Acute blood loss anemia   AKI (acute kidney injury) ([place])    CAD (coronary artery disease)   Psoriasis   Post-ictal state ([place])   Acute Encephalopathy -Resolved -MRI; no acute findings see results below -8/21, 24-hour EEG per neurology -Keppra 500 mg daily -We will await further recommendations from neurology, once patient's 24-hour EEG complete.  Acute blood loss anemia with Hx colon cancer -Last chemotherapy June 2021 -Fecal occult positive -8/19 transfuse 1 unit PRBC -Trend H/H Lab Results  Component Value Date   HGB 8.2 (L) 01/04/2020   HGB 8.0 (L) 01/03/2020   HGB 7.5 (L) 01/02/2020   HGB 6.6 (LL) 01/01/2020   HGB 10.5 (L) 01/01/2020  -Stable -Transfuse for hemoglobin<7   CAD status post CABG: -See HTN  Essential HTN -8/21 Coreg 6.25 mg BID -Hydralazine IV PRN  Psoriatic arthritis: -Discussed with patient his previous medication.  States he had been on methotrexate but has not taken that medication in significant amount of time.    CKD stage stage IV?  (Appears baseline Cr 2.74) Lab Results  Component Value Date   CREATININE 2.73 (H) 01/04/2020   CREATININE 2.74 (H) 01/03/2020   CREATININE 2.82 (H) 01/02/2020  -Monitor closely -Hold all nephrotoxic medication -8/22 baseline  Hypokalemia -Potassium goal> 4  Hypomagnesmia -Magnesium goal> 2 -Magnesium IV 2 g   DVT prophylaxis: SCD Code Status: Full Family Communication:  Status is: Inpatient    Dispo: The patient is from: Home              Anticipated d/c is  to: Home              Anticipated d/c date is: 8/24              Patient currently unstable      Consultants:  Neurology  Procedures/Significant Events:  8/19 MRI brain W. Wo contrast;-No acute intracranial abnormality. 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 -Moderate for age signal changes in the cerebral white matter, basal ganglia, and pons, most commonly due to chronic small vessel disease.  I have personally reviewed and interpreted all radiology  studies and my findings are as above.  VENTILATOR SETTINGS:    Cultures 8/19 SARS coronavirus negative   Antimicrobials:     Devices    LINES / TUBES:      Continuous Infusions:   Objective: Vitals:   01/04/20 0739 01/04/20 0800 01/04/20 0840 01/04/20 1239  BP: (!) 157/145 (!) 153/88 (!) 153/88 (!) 132/96  Pulse: 93  79 79  Resp: (!) 22  (!) 22 19  Temp:  98.1 F (36.7 C) 98.1 F (36.7 C) 98.8 F (37.1 C)  TempSrc:  Oral Oral Oral  SpO2: 97%  96% 99%  Weight:      Height:        Intake/Output Summary (Last 24 hours) at 01/04/2020 1962 Last data filed at 01/04/2020 1200 Gross per 24 hour  Intake 234 ml  Output 450 ml  Net -216 ml   Filed Weights   01/01/20 0529 01/01/20 2157  Weight: 61.3 kg 60.6 kg   Physical Exam:  General: A/O x4, No acute respiratory distress Eyes: negative scleral hemorrhage, negative anisocoria, negative icterus ENT: Negative Runny nose, negative gingival bleeding, Neck:  Negative scars, masses, torticollis, lymphadenopathy, JVD Lungs: Clear to auscultation bilaterally without wheezes or crackles Cardiovascular: Regular rate and rhythm without murmur gallop or rub normal S1 and S2 Abdomen: negative abdominal pain, nondistended, positive soft, bowel sounds, no rebound, no ascites, no appreciable mass Extremities: No significant cyanosis, clubbing, or edema bilateral lower extremities Skin: Negative rashes, lesions, ulcers Psychiatric:  Negative depression, negative anxiety, negative fatigue, negative mania  Central nervous system:  Cranial nerves II through XII intact, tongue/uvula midline, all extremities muscle strength 5/5, sensation intact throughout,  negative dysarthria, negative expressive aphasia, negative receptive aphasia.  .     Data Reviewed: Care during the described time interval was provided by me .  I have reviewed this patient's available data, including medical history, events of note, physical examination,  and all test results as part of my evaluation.  CBC: Recent Labs  Lab 01/01/20 0107 01/01/20 0116 01/02/20 1158 01/03/20 0850 01/04/20 0500  WBC  --  11.1* 8.1 7.3 6.9  NEUTROABS  --  7.5  --  4.8 4.2  HGB 10.5* 6.6* 7.5* 8.0* 8.2*  HCT 31.0* 22.9* 24.0* 25.0* 26.0*  MCV  --  96.2 88.2 88.3 88.7  PLT  --  230 196 184 229   Basic Metabolic Panel: Recent Labs  Lab 01/01/20 0107 01/01/20 0116 01/02/20 1158 01/03/20 0850 01/04/20 0500  NA 140 140 139 139 136  K 3.8 4.0 3.5 3.4* 4.6  CL 103 105 103 104 104  CO2  --  17* 24 25 24   GLUCOSE 108* 110* 127* 132* 102*  BUN 29* 27* 27* 22 25*  CREATININE 3.20* 2.92* 2.82* 2.74* 2.73*  CALCIUM  --  6.4* 6.8* 6.9* 7.1*  MG  --   --   --  1.0* 1.7  PHOS  --   --   --  2.9 3.3   GFR: Estimated Creatinine Clearance: 17.6 mL/min (A) (by C-G formula based on SCr of 2.73 mg/dL (H)). Liver Function Tests: Recent Labs  Lab 01/01/20 0116 01/03/20 0850 01/04/20 0500  AST 20 16 15   ALT 11 9 20   ALKPHOS 79 85 85  BILITOT 0.4 0.4 0.5  PROT 5.7* 5.5* 5.3*  ALBUMIN 2.3* 2.0* 1.9*   No results for input(s): LIPASE, AMYLASE in the last 168 hours. No results for input(s): AMMONIA in the last 168 hours. Coagulation Profile: Recent Labs  Lab 01/01/20 0149  INR 1.1   Cardiac Enzymes: No results for input(s): CKTOTAL, CKMB, CKMBINDEX, TROPONINI in the last 168 hours. BNP (last 3 results) No results for input(s): PROBNP in the last 8760 hours. HbA1C: No results for input(s): HGBA1C in the last 72 hours. CBG: Recent Labs  Lab 01/03/20 1213 01/03/20 1646 01/03/20 2342 01/04/20 0604 01/04/20 1238  GLUCAP 84 76 105* 97 107*   Lipid Profile: No results for input(s): CHOL, HDL, LDLCALC, TRIG, CHOLHDL, LDLDIRECT in the last 72 hours. Thyroid Function Tests: No results for input(s): TSH, T4TOTAL, FREET4, T3FREE, THYROIDAB in the last 72 hours. Anemia Panel: No results for input(s): VITAMINB12, FOLATE, FERRITIN, TIBC, IRON,  RETICCTPCT in the last 72 hours. Sepsis Labs: No results for input(s): PROCALCITON, LATICACIDVEN in the last 168 hours.  Recent Results (from the past 240 hour(s))  SARS Coronavirus 2 by RT PCR (hospital order, performed in [facility] hospital lab) Nasopharyngeal Nasopharyngeal Swab     Status: None   Collection Time: 01/01/20  6:12 AM   Specimen: Nasopharyngeal Swab  Result Value Ref Range Status   SARS Coronavirus 2 NEGATIVE NEGATIVE Final    Comment: (NOTE) SARS-CoV-2 target nucleic acids are NOT DETECTED.  The SARS-CoV-2 RNA is generally detectable in upper and lower respiratory specimens during the acute phase of infection. The lowest concentration of SARS-CoV-2 viral copies this assay can detect is 250 copies / mL. A negative result does not preclude SARS-CoV-2 infection and should not be used as the sole basis for treatment or other patient management decisions.  A negative result may occur with improper specimen collection / handling, submission of specimen other than nasopharyngeal swab, presence of viral mutation(s) within the areas targeted by this assay, and inadequate number of viral copies (<250 copies / mL). A negative result must be combined with clinical observations, patient history, and epidemiological information.  Fact Sheet for Patients:   StrictlyIdeas.no  Fact Sheet for Healthcare Providers: BankingDealers.co.za  This test is not yet approved or  cleared by the [place] FDA and has been authorized for detection and/or diagnosis of SARS-CoV-2 by FDA under an Emergency Use Authorization (EUA).  This EUA will remain in effect (meaning this test can be used) for the duration of the COVID-19 declaration under Section 564(b)(1) of the Act, 21 U.S.C. section 360bbb-3(b)(1), unless the authorization is terminated or revoked sooner.  Performed at [facility], [place] [address] [place], [place] [address]           Radiology Studies: Overnight EEG with video  Result Date: 01/04/2020 Lora Havens, MD     01/04/2020  9:17 AM Patient Name: Reginald Todd MRN: [id number] Epilepsy Attending: Lora Havens Referring Physician/Provider: Dr. Roland Rack Duration: 12/14/2019 1700 to 01/04/2020 0915  Patient history: 86 year old male presented with altered mental status and right gaze deviation as well as seizure-like activity.  EEG evaluate for seizures.  Level of alertness: lethargic  AEDs during EEG  study: LEV  Technical aspects: This EEG study was done with scalp electrodes positioned according to the 10-20 International system of electrode placement. Electrical activity was acquired at a sampling rate of 500Hz  and reviewed with a high frequency filter of 70Hz  and a low frequency filter of 1Hz . EEG data were recorded continuously and digitally stored.  Description:  The posterior dominant rhythm consists of 8 Hz activity of moderate voltage (25-35 uV) seen predominantly in posterior head regions, symmetric and reactive to eye opening and eye closing. Sleep was characterized by vertex waves, sleep spindles (12 to 14 Hz), maximal frontocentral region.   EEG showed intermittent generalized rhythmic high amplitude 2-3hz  delta slowing. Hyperventilation and photic stimulation were not performed.    ABNORMALITY -Intermittent rhythmic slow, generalized  IMPRESSION: This study is suggestive of mild diffuse encephalopathy, nonspecific etiology. No seizures or definite epileptiform discharges were seen throughout the recording. Priyanka Barbra Sarks        Scheduled Meds: . sodium chloride   Intravenous Once  . carvedilol  6.25 mg Oral BID WC  . Chlorhexidine Gluconate Cloth  6 each Topical Daily  . levETIRAcetam  500 mg Oral Daily   Continuous Infusions:   LOS: 3 days    Time spent:40 min    Danyle Boening, Geraldo Docker, MD [facility] Pager [phone]  If 7PM-7AM, please contact  night-coverage [email or web address] Password [email or web address] 01/04/2020, 4:02 PM

## 2020-01-05 LAB — CBC WITH DIFFERENTIAL/PLATELET
Abs Immature Granulocytes: 0.04 10*3/uL (ref 0.00–0.07)
Basophils Absolute: 0.1 10*3/uL (ref 0.0–0.1)
Basophils Relative: 1 %
Eosinophils Absolute: 0.2 10*3/uL (ref 0.0–0.5)
Eosinophils Relative: 3 %
HCT: 26.3 % — ABNORMAL LOW (ref 39.0–52.0)
Hemoglobin: 8.3 g/dL — ABNORMAL LOW (ref 13.0–17.0)
Immature Granulocytes: 1 %
Lymphocytes Relative: 25 %
Lymphs Abs: 1.8 10*3/uL (ref 0.7–4.0)
MCH: 28 pg (ref 26.0–34.0)
MCHC: 31.6 g/dL (ref 30.0–36.0)
MCV: 88.9 fL (ref 80.0–100.0)
Monocytes Absolute: 0.9 10*3/uL (ref 0.1–1.0)
Monocytes Relative: 13 %
Neutro Abs: 4.1 10*3/uL (ref 1.7–7.7)
Neutrophils Relative %: 57 %
Platelets: 183 10*3/uL (ref 150–400)
RBC: 2.96 MIL/uL — ABNORMAL LOW (ref 4.22–5.81)
RDW: 14.5 % (ref 11.5–15.5)
WBC: 7.1 10*3/uL (ref 4.0–10.5)
nRBC: 0 % (ref 0.0–0.2)

## 2020-01-05 LAB — COMPREHENSIVE METABOLIC PANEL
ALT: 11 U/L (ref 0–44)
AST: 14 U/L — ABNORMAL LOW (ref 15–41)
Albumin: 2 g/dL — ABNORMAL LOW (ref 3.5–5.0)
Alkaline Phosphatase: 85 U/L (ref 38–126)
Anion gap: 7 (ref 5–15)
BUN: 30 mg/dL — ABNORMAL HIGH (ref 8–23)
CO2: 23 mmol/L (ref 22–32)
Calcium: 7.4 mg/dL — ABNORMAL LOW (ref 8.9–10.3)
Chloride: 106 mmol/L (ref 98–111)
Creatinine, Ser: 3.09 mg/dL — ABNORMAL HIGH (ref 0.61–1.24)
GFR calc Af Amer: 21 mL/min — ABNORMAL LOW (ref >60)
GFR calc non Af Amer: 18 mL/min — ABNORMAL LOW (ref >60)
Glucose, Bld: 100 mg/dL — ABNORMAL HIGH (ref 70–99)
Potassium: 5.2 mmol/L — ABNORMAL HIGH (ref 3.5–5.1)
Sodium: 136 mmol/L (ref 135–145)
Total Bilirubin: 0.2 mg/dL — ABNORMAL LOW (ref 0.3–1.2)
Total Protein: 5.4 g/dL — ABNORMAL LOW (ref 6.5–8.1)

## 2020-01-05 LAB — GLUCOSE, CAPILLARY
Glucose-Capillary: 106 mg/dL — ABNORMAL HIGH (ref 70–99)
Glucose-Capillary: 111 mg/dL — ABNORMAL HIGH (ref 70–99)
Glucose-Capillary: 89 mg/dL (ref 70–99)
Glucose-Capillary: 94 mg/dL (ref 70–99)

## 2020-01-05 LAB — MAGNESIUM: Magnesium: 1.5 mg/dL — ABNORMAL LOW (ref 1.7–2.4)

## 2020-01-05 LAB — PHOSPHORUS: Phosphorus: 3.4 mg/dL (ref 2.5–4.6)

## 2020-01-05 MED ORDER — OXYCODONE HCL 5 MG PO TABS
7.5000 mg | ORAL_TABLET | Freq: Once | ORAL | Status: AC
  Administered 2020-01-05: 7.5 mg via ORAL
  Filled 2020-01-05: qty 2

## 2020-01-05 MED ORDER — LEVETIRACETAM ER 500 MG PO TB24: 500.0000 mg | ORAL_TABLET | Freq: Every day | ORAL | 0 refills | Status: AC

## 2020-01-05 MED ORDER — MAGNESIUM SULFATE 2 GM/50ML IV SOLN
2.0000 g | Freq: Once | INTRAVENOUS | Status: AC
  Administered 2020-01-05: 2 g via INTRAVENOUS
  Filled 2020-01-05: qty 50

## 2020-01-05 MED ORDER — HEPARIN SOD (PORK) LOCK FLUSH 100 UNIT/ML IV SOLN
500.0000 [IU] | INTRAVENOUS | Status: AC | PRN
  Administered 2020-01-05: 500 [IU]
  Filled 2020-01-05: qty 5

## 2020-01-05 MED ORDER — SODIUM ZIRCONIUM CYCLOSILICATE 10 G PO PACK
10.0000 g | PACK | Freq: Once | ORAL | Status: AC
  Administered 2020-01-05: 10 g via ORAL
  Filled 2020-01-05: qty 1

## 2020-01-05 MED FILL — LEVETIRACETAM ER 500 MG TAB: 500 | 30 days supply | Qty: 30 | Fill #0

## 2020-01-05 NOTE — TOC Transition Note (Signed)
Transition of Care [facility]) - CM/SW Discharge Note   Patient Details  Name: Reginald Todd MRN: [id number] Date of Birth: [birth year]  [facility]) CM/SW Contact:  Pollie Friar, RN Phone Number: 01/05/2020, 4:41 PM   Clinical Narrative:    Pt discharging home with [facility] services through [place]. Cory with [facility] accepted the referral. Pt has all needed DME at home.  Pt has transportation home.    Final next level of care: Home w Home Health Services Barriers to Discharge: No Barriers Identified   Patient Goals and CMS Choice   CMS Medicare.gov Compare Post Acute Care list provided to:: Patient Choice offered to / list presented to : Patient  Discharge Placement                       Discharge Plan and Services   Discharge Planning Services: CM Consult Post Acute Care Choice: Home Health                    HH Arranged: PT, OT [facility] Agency: [place] Date [place]: 01/05/20   Representative spoke with at [place]: [place] ([place]) Interventions     Readmission Risk Interventions No flowsheet data found.

## 2020-01-05 NOTE — TOC Initial Note (Addendum)
Transition of Care [facility]) - Initial/Assessment Note    Patient Details  Name: Reginald Todd MRN: [id number] Date of Birth: Sep 23, 1933  Transition of Care [facility]) CM/SW Contact:    Pollie Friar, RN Phone Number: 01/05/2020, 10:56 AM  Clinical Narrative:                 Pt states he is home with his spouse and daughter. Pt was driving prior to admission and does his own medications at home.  Choice provided for [facility] agency and [facility] decided on. Cory with [facility] accepted the referral. 3 in 1 recommended. Pt states he has one at home. Pt has transport home when medically ready.   Expected Discharge Plan: [place] Barriers to Discharge: Continued Medical Work up   Patient Goals and CMS Choice   CMS Medicare.gov Compare Post Acute Care list provided to:: Patient Choice offered to / list presented to : Patient  Expected Discharge Plan and Services Expected Discharge Plan: [place]   Discharge Planning Services: CM Consult Post Acute Care Choice: [place] arrangements for the past 2 months: Single Family Home                           HH Arranged: PT, OT HH Agency: [place] Date [facility] Agency Contacted: 01/05/20   Representative spoke with at [place]: Tommi Rumps  Prior Living Arrangements/Services Living arrangements for the past 2 months: [place] Lives with:: Significant Other, Adult Children Patient language and need for interpreter reviewed:: Yes Do you feel safe going back to the place where you live?: Yes      Need for Family Participation in Patient Care: Yes (Comment) Care giver support system in place?: Yes (comment) (states wife is home 24 hrs/ day. daughter home in evenings and at night) Current home services: DME (walker/ cane/ shower seat/ 3 in 1) Criminal Activity/Legal Involvement Pertinent to Current Situation/Hospitalization: No - Comment as needed  Activities of Daily Living       Permission Sought/Granted                  Emotional Assessment Appearance:: Appears stated age Attitude/Demeanor/Rapport: Engaged Affect (typically observed): Accepting Orientation: : Oriented to Self, Oriented to Place, Oriented to  Time, Oriented to Situation   Psych Involvement: No (comment)  Admission diagnosis:  Hypocalcemia [E83.51] Status epilepticus ([place]) [G40.901] Seizures (HCC) [R56.9] Acute encephalopathy [G93.40] AKI (acute kidney injury) ([place]) [N17.9] Patient Active Problem List   Diagnosis Date Noted  . Acute encephalopathy 01/01/2020  . Acute blood loss anemia 01/01/2020  . AKI (acute kidney injury) ([place]) 01/01/2020  . CAD (coronary artery disease) 01/01/2020  . Psoriasis 01/01/2020  . Post-ictal state ([place]) 01/01/2020   PCP:  Rochel Brome, MD Pharmacy:   [facility] [address], [place] [address] [place] [address] Phone: [phone] Fax: [phone]     Social Determinants of Health (SDOH) Interventions    Readmission Risk Interventions No flowsheet data found.

## 2020-01-05 NOTE — Procedures (Addendum)
Patient Name:Reginald Todd ATF:[id number] Epilepsy Attending:Doriana Mazurkiewicz Barbra Sarks Referring Physician/Provider:Dr. Roland Rack Duration:01/04/2020 [id number] to 01/05/2020 [phone]  Patient history:86 year old male presented with altered mental status and right gaze deviation as well as seizure-like activity. EEG evaluate for seizures.  Level of alertness: lethargic  AEDs during EEG study:LEV  Technical aspects: This EEG study was done with scalp electrodes positioned according to the 10-20 International system of electrode placement. Electrical activity was acquired at a sampling rate of 500Hz  and reviewed with a high frequency filter of 70Hz  and a low frequency filter of 1Hz . EEG data were recorded continuously and digitally stored.   Description: The posterior dominant rhythm consists of 8 Hz activity of moderate voltage (25-35 uV) seen predominantly in posterior head regions, symmetric and reactive to eye opening and eye closing. Sleep was characterized by vertex waves, sleep spindles (12 to 14 Hz), maximal frontocentral region.  EEG showed intermittent generalized rhythmic high amplitude 2-3hz  delta slowing.Hyperventilation and photic stimulation were not performed.  ABNORMALITY -Intermittent rhythmic slow, generalized  IMPRESSION: This study is suggestive of mild diffuse encephalopathy, nonspecific etiology.No seizures ordefiniteepileptiform discharges were seen throughout the recording.  [name] Barbra Sarks

## 2020-01-05 NOTE — Progress Notes (Signed)
Discharge instructions discussed with patient. Wife and daughter at bedside. All questions answered. Port de-accessed, telemetry discontinued. Daughter will transport patient home. Belongings returned to daughter. Gwendolyn Grant, RN

## 2020-01-05 NOTE — Progress Notes (Signed)
vLTM EEG complete. No skin breakdown 

## 2020-01-05 NOTE — Discharge Summary (Signed)
Physician Discharge Summary  Lloyde Ludlam DDU:[id number] DOB: [birth year] DOA: 01/01/2020  PCP: Rochel Brome, MD  Admit date: 01/01/2020 Discharge date: 01/06/2020  Time spent: 35 minutes  Recommendations for Outpatient Follow-up:  Covid vaccination; positive vaccination  Acute Encephalopathy -Resolved -MRI; no acute findings see results below -8/21, 24-hour EEG per neurology; no seizures see results below -Keppra 500 mg daily -Patient counseled he cannot drive or operate heavy machinery for 6 months, until cleared by a neurologist -Schedule follow-up appointment with first available neurologist at  [facility] neurology or [facility] neurology in 10 to 12 weeks s/p seizures  Acute blood loss anemia with Hx colon cancer -Last chemotherapy June 2021 -Fecal occult positive -8/19 transfuse 1 unit PRBC -Trend H/H Lab Results  Component Value Date   HGB 8.3 (L) 01/05/2020   HGB 8.2 (L) 01/04/2020   HGB 8.0 (L) 01/03/2020   HGB 7.5 (L) 01/02/2020   HGB 6.6 (LL) 01/01/2020  -Hemoglobin stable -Transfuse for hemoglobin<7  CAD status post CABG: -See HTN  Essential HTN -8/21 Coreg 6.25 mg BID  Psoriatic arthritis: -Discussed with patient his previous medication.  States he had been on methotrexate but has not taken that medication in significant amount of time.    CKD stage stage IV?  (Appears baseline Cr 2.74) Lab Results  Component Value Date   CREATININE 3.09 (H) 01/05/2020   CREATININE 2.73 (H) 01/04/2020   CREATININE 2.74 (H) 01/03/2020   CREATININE 2.82 (H) 01/02/2020   CREATININE 2.92 (H) 01/01/2020  -Creatinine slightly above baseline at discharge.  Patient's PCP will have to monitor his renal function closely. -Continue to avoid nephrotoxic medication.  Hyperkalemia -8/23 Lokelma 10 g prior to discharge  Hypomagnesmia -Magnesium IV 2 g prior to discharge      Discharge Diagnoses:  Principal Problem:   Acute encephalopathy Active Problems:   Acute blood loss  anemia   AKI (acute kidney injury) ([place])   CAD (coronary artery disease)   Psoriasis   Post-ictal state ([place])   Discharge Condition: Stable  Diet recommendation: Heart healthy  Filed Weights   01/01/20 0529 01/01/20 2157  Weight: 61.3 kg 60.6 kg    History of present illness:  86 y.o.BM PMHx colon cancer S/P Hemicolectomy with colostomy last chemotherapy in June 2021 at [facility] with history of CAD status post CABG, HTN, Psoriasis on methotrexate   Found to be slightly confused with right-sided gaze preference by patient's wife at around 10:00 last night. Patient's daughter talked to the patient around 8 PM when patient was appearing normal. Patient did not have any fever chills or any change in medications recently. Per daughter patient use to drink a lot of alcohol prior to the colon cancer diagnosis and recently last 2 years has been drinking once a week.  ED Course:In the ER patient was evaluated by the neurologist. CT head and CT angiogram of the head and neck did not show any large vessel obstruction or any acute findings. Concern for status epilepticus patient was loaded with 2 g IV Keppra and continuous EEG was placed. At the time of my exam patient is more alert but still confused repeating my questions. Labs are significant for corrected calcium was 7.4 hemoglobin of 6.6 with colostomy bag showing liquid stools which are brown in color but stool for occult blood positive. Creatinine is 2.9 the last 1 available in care everywhere instead in 2017 was around 1.5 with GFR of around 40%. Bicarb is 17 now with albumin of 2.3. WBC 11.1 chest  x-ray is pending UA is unremarkable. Patient admitted for acute encephalopathy likely postictal with anemia with blood loss and mild hypocalcemia.  Hospital Course:  See above   Procedures: 8/19 MRI brain W. Wo contrast;-No acute intracranial abnormality. 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 -Moderate for age signal changes in the cerebral white matter, basal ganglia, and pons, most commonly due to chronic small vessel disease. 8/22 overnight EEG; suggestive of mild diffuse encephalopathy, nonspecific etiology.No seizures ordefiniteepileptiform discharges were seen throughout the recording.   Consultations: Neurology  Cultures  8/19 SARS coronavirus negative    Discharge Exam: Vitals:   01/05/20 0300 01/05/20 0709 01/05/20 1100 01/05/20 1604  BP: (!) 151/95 137/88 127/85 (!) 143/87  Pulse: 78 85 73 75  Resp: (!) 22 18 17 20   Temp: 98.1 F (36.7 C)  97.7 F (36.5 C) 98.3 F (36.8 C)  TempSrc: Oral  Oral Oral  SpO2:  91% 100% 99%  Weight:      Height:        General: A/O x4, No acute respiratory distress Eyes: negative scleral hemorrhage, negative anisocoria, negative icterus ENT: Negative Runny nose, negative gingival bleeding, Neck:  Negative scars, masses, torticollis, lymphadenopathy, JVD Lungs: Clear to auscultation bilaterally without wheezes or crackles Cardiovascular: Regular rate and rhythm without murmur gallop or rub normal S1 and S2  Discharge Instructions   Allergies as of 01/05/2020   No Known Allergies     Medication List    STOP taking these medications   latanoprost 0.005 % ophthalmic solution Commonly known as: XALATAN   potassium chloride SA 20 MEQ tablet Commonly known as: KLOR-CON     TAKE these medications   atorvastatin 20 MG tablet Commonly known as: LIPITOR Take 20 mg by mouth daily.   carvedilol 6.25 MG tablet Commonly known as: COREG Take 6.25 mg by mouth 2 (two) times daily with a meal.   CENTRUM SILVER ADULT 50+ PO Take 1 tablet by mouth daily.   DIALYVITE PROBIOTIC PO Take 1 capsule by mouth daily.   folic acid 1 MG tablet Commonly known as: FOLVITE Take 1 mg by mouth daily.   HYDROcodone-acetaminophen 7.5-325 MG tablet Commonly known as: NORCO Take 1 tablet by mouth every 6 (six) hours  as needed.   levETIRAcetam 500 MG 24 hr tablet Commonly known as: KEPPRA XR Take 1 tablet (500 mg total) by mouth daily.   nitroGLYCERIN 0.4 MG SL tablet Commonly known as: NITROSTAT Place 0.4 mg under the tongue every 5 (five) minutes as needed for chest pain.   Omega-3 300 MG Caps Take 1 capsule by mouth daily.   ondansetron 4 MG tablet Commonly known as: ZOFRAN Take 4 mg by mouth every 8 (eight) hours as needed for nausea or vomiting.   pantoprazole 40 MG tablet Commonly known as: PROTONIX Take 40 mg by mouth 2 (two) times daily.   QC Tumeric Complex 500 MG Caps Generic drug: Turmeric Take 500 mg by mouth daily.   tamsulosin 0.4 MG Caps capsule Commonly known as: FLOMAX Take 0.4 mg by mouth at bedtime.      No Known Allergies  Follow-up Information    [facility] Follow up on 03/10/2020.   Why: appt is at 11:30. please arrive at 11:00 Contact information: [place] [phone] [place], [facility] Follow up.   Specialty: Home Health Services Why: The home health  agency will contact you for the first home visit. Contact information: [place] [address] [phone]                The results of significant diagnostics from this hospitalization (including imaging, microbiology, ancillary and laboratory) are listed below for reference.    Significant Diagnostic Studies: EEG  Result Date: 01/01/2020 Lora Havens, MD     01/01/2020 11:06 AM Patient Name: Alva Broxson MRN: [id number] Epilepsy Attending: Lora Havens Referring Physician/Provider: Dr. Donnetta Simpers Date: 8/90/2021 Duration: 24.39 minutes Patient history: 86 year old male presented with altered mental status and right gaze deviation as well as seizure-like activity.  EEG evaluate for seizures. Level of alertness: lethargic AEDs during EEG study: LEV Technical aspects:  This EEG study was done with scalp electrodes positioned according to the 10-20 International system of electrode placement. Electrical activity was acquired at a sampling rate of 500Hz  and reviewed with a high frequency filter of 70Hz  and a low frequency filter of 1Hz . EEG data were recorded continuously and digitally stored. Description: No clear posterior dominant was seen.  EEG showed continuous generalized 3 to 5 Hz theta-delta slowing.  Hyperventilation and photic stimulation were not performed.   ABNORMALITY -Continuous slow, generalized IMPRESSION: This study is suggestive ofmoderate diffuse encephalopathy, nonspecific etiology. No seizures or definite epileptiform discharges were seen throughout the recording. Lora Havens   MR BRAIN WO CONTRAST  Result Date: 01/01/2020 CLINICAL DATA:  86 year old male with history of colon cancer status post chemotherapy in June. Confusion with right side gaze preference 2200 hours yesterday. EXAM: MRI HEAD WITHOUT CONTRAST TECHNIQUE: Multiplanar, multiecho pulse sequences of the brain and surrounding structures were obtained without intravenous contrast. COMPARISON:  CT head and CTA head and neck earlier today. FINDINGS: Brain: No restricted diffusion or evidence of acute infarction. 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 Small area of chronic cortical encephalomalacia in the left lateral occipital lobe. Occasional chronic micro hemorrhages, including in the right posterior lentiform on series 8, image 37. Hippocampal formations and mesial temporal lobes appear symmetric and within normal limits for age. Patchy and confluent bilateral cerebral white matter T2 and FLAIR hyperintensity. T2 heterogeneity in the bilateral basal ganglia appears more related to perivascular spaces. Mild T2 heterogeneity in the pons. No midline shift, mass effect, evidence of mass lesion, ventriculomegaly, extra-axial collection or acute intracranial hemorrhage. Cervicomedullary  junction and pituitary are within normal limits. Vascular: Major intracranial vascular flow voids are preserved. Skull and upper cervical spine: Negative for age visible cervical spine, bone marrow signal. Sinuses/Orbits: Postoperative changes to both globes. Otherwise negative orbits. Trace paranasal sinus mucosal thickening. Other: Mastoids are clear. Visible internal auditory structures appear normal. Trace retained secretions in the nasopharynx. IMPRESSION: 1. No acute intracranial abnormality. 2. 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 3. Moderate for age signal changes in the cerebral white matter, basal ganglia, and pons, most commonly due to chronic small vessel disease. Electronically Signed   By: Genevie Ann M.D.   On: 01/01/2020 12:33   DG CHEST PORT 1 VIEW  Result Date: 01/01/2020 CLINICAL DATA:  Seizure. EXAM: PORTABLE CHEST 1 VIEW COMPARISON:  Chest x-ray 05/31/2019, 03/15/2018.  CT 05/27/2019. FINDINGS: Patient rotated to the left. PowerPort catheter stable position. Prior CABG. Stable cardiomegaly. Diffuse bilateral pulmonary interstitial prominence consistent with interstitial edema and or pneumonitis. Small right pleural effusion. 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 IMPRESSION: 1.  PowerPort  catheter stable position. 2.  Prior CABG.  Stable cardiomegaly. 3. Diffuse bilateral pulmonary interstitial prominence consistent with interstitial edema and or pneumonitis. 4. Calcified basal pleural plaques again noted consistent prior asbestos exposure. Electronically Signed   By: Marcello Moores  Register   On: 01/01/2020 06:08   Overnight EEG with video  Result Date: 01/04/2020 Lora Havens, MD     01/05/2020  8:25 AM Patient Name: Tyquan Carmickle MRN: [id number] Epilepsy Attending: Lora Havens Referring Physician/Provider: Dr. Roland Rack Duration: 01/03/2020 2353 to 01/04/2020 0947  Patient history: 86 year old male  presented with altered mental status and right gaze deviation as well as seizure-like activity.  EEG evaluate for seizures.  Level of alertness: lethargic  AEDs during EEG study: LEV  Technical aspects: This EEG study was done with scalp electrodes positioned according to the 10-20 International system of electrode placement. Electrical activity was acquired at a sampling rate of 500Hz  and reviewed with a high frequency filter of 70Hz  and a low frequency filter of 1Hz . EEG data were recorded continuously and digitally stored.  Description:  The posterior dominant rhythm consists of 8 Hz activity of moderate voltage (25-35 uV) seen predominantly in posterior head regions, symmetric and reactive to eye opening and eye closing. Sleep was characterized by vertex waves, sleep spindles (12 to 14 Hz), maximal frontocentral region.   EEG showed intermittent generalized rhythmic high amplitude 2-3hz  delta slowing. Hyperventilation and photic stimulation were not performed.    ABNORMALITY -Intermittent rhythmic slow, generalized  IMPRESSION: This study is suggestive of mild diffuse encephalopathy, nonspecific etiology. No seizures or definite epileptiform discharges were seen throughout the recording. Lora Havens   CT HEAD CODE STROKE WO CONTRAST  Result Date: 01/01/2020 CLINICAL DATA:  Code stroke.  Fixed rightward gaze. EXAM: CT HEAD WITHOUT CONTRAST TECHNIQUE: Contiguous axial images were obtained from the base of the skull through the vertex without intravenous contrast. COMPARISON:  None. FINDINGS: Brain: There is no mass, hemorrhage or extra-axial collection. There is generalized atrophy without lobar predilection. There is hypoattenuation of the periventricular white matter, most commonly indicating chronic ischemic microangiopathy. There is an old left occipital lobe infarct. Vascular: No abnormal hyperdensity of the major intracranial arteries or dural venous sinuses. No intracranial atherosclerosis.  Skull: The visualized skull base, calvarium and extracranial soft tissues are normal. Sinuses/Orbits: No fluid levels or advanced mucosal thickening of the visualized paranasal sinuses. No mastoid or middle ear effusion. The orbits are normal. ASPECTS [facility] Stroke Program Early CT Score) - Ganglionic level infarction (caudate, lentiform nuclei, internal capsule, insula, M1-M3 cortex): 7 - Supraganglionic infarction (M4-M6 cortex): 3 Total score (0-10 with 10 being normal): 10 IMPRESSION: 1. No acute intracranial abnormality. 2. ASPECTS is 10. 3. 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 These results were communicated to Dr. Donnetta Simpers at 12:58 am on 01/01/2020 by text page via the [facility] messaging system. Electronically Signed   By: Ulyses Jarred M.D.   On: 01/01/2020 01:00   CT ANGIO HEAD CODE STROKE  Result Date: 01/01/2020 CLINICAL DATA:  Fixed rightward gaze EXAM: CT ANGIOGRAPHY HEAD AND NECK TECHNIQUE: Multidetector CT imaging of the head and neck was performed using the standard protocol during bolus administration of intravenous contrast. Multiplanar CT image reconstructions and MIPs were obtained to evaluate the vascular anatomy. Carotid stenosis measurements (when applicable) are obtained utilizing NASCET criteria, using the distal internal carotid diameter as the denominator. CONTRAST:  16mL OMNIPAQUE IOHEXOL 350 MG/ML SOLN COMPARISON:  None. FINDINGS: CTA NECK FINDINGS  SKELETON: There is no bony spinal canal stenosis. No lytic or blastic lesion. OTHER NECK: Normal pharynx, larynx and major salivary glands. No cervical lymphadenopathy. Unremarkable thyroid gland. UPPER CHEST: No pneumothorax or pleural effusion. No nodules or masses. AORTIC ARCH: There is no calcific atherosclerosis of the aortic arch. There is no aneurysm, dissection or hemodynamically significant stenosis of the visualized portion of the aorta. Conventional 3 vessel aortic branching pattern. The  visualized proximal subclavian arteries are widely patent. RIGHT CAROTID SYSTEM: Normal without aneurysm, dissection or stenosis. LEFT CAROTID SYSTEM: Normal without aneurysm, dissection or stenosis. VERTEBRAL ARTERIES: Left dominant configuration. Both origins are clearly patent. There is no dissection, occlusion or flow-limiting stenosis to the skull base (V1-V3 segments). CTA HEAD FINDINGS POSTERIOR CIRCULATION: --Vertebral arteries: Normal V4 segments. --Inferior cerebellar arteries: Normal. --Basilar artery: Normal. --Superior cerebellar arteries: Normal. --Posterior cerebral arteries (PCA): Normal. ANTERIOR CIRCULATION: --Intracranial internal carotid arteries: Atherosclerotic calcification of the internal carotid arteries at the skull base without hemodynamically significant stenosis. --Anterior cerebral arteries (ACA): Normal. Both A1 segments are present. Patent anterior communicating artery (a-comm). --Middle cerebral arteries (MCA): Normal. VENOUS SINUSES: As permitted by contrast timing, patent. ANATOMIC VARIANTS: None Review of the MIP images confirms the above findings. IMPRESSION: Normal CTA of the head and neck. Electronically Signed   By: Ulyses Jarred M.D.   On: 01/01/2020 03:24   CT ANGIO NECK CODE STROKE  Result Date: 01/01/2020 CLINICAL DATA:  Fixed rightward gaze EXAM: CT ANGIOGRAPHY HEAD AND NECK TECHNIQUE: Multidetector CT imaging of the head and neck was performed using the standard protocol during bolus administration of intravenous contrast. Multiplanar CT image reconstructions and MIPs were obtained to evaluate the vascular anatomy. Carotid stenosis measurements (when applicable) are obtained utilizing NASCET criteria, using the distal internal carotid diameter as the denominator. CONTRAST:  90mL OMNIPAQUE IOHEXOL 350 MG/ML SOLN COMPARISON:  None. FINDINGS: CTA NECK FINDINGS SKELETON: There is no bony spinal canal stenosis. No lytic or blastic lesion. OTHER NECK: Normal pharynx,  larynx and major salivary glands. No cervical lymphadenopathy. Unremarkable thyroid gland. UPPER CHEST: No pneumothorax or pleural effusion. No nodules or masses. AORTIC ARCH: There is no calcific atherosclerosis of the aortic arch. There is no aneurysm, dissection or hemodynamically significant stenosis of the visualized portion of the aorta. Conventional 3 vessel aortic branching pattern. The visualized proximal subclavian arteries are widely patent. RIGHT CAROTID SYSTEM: Normal without aneurysm, dissection or stenosis. LEFT CAROTID SYSTEM: Normal without aneurysm, dissection or stenosis. VERTEBRAL ARTERIES: Left dominant configuration. Both origins are clearly patent. There is no dissection, occlusion or flow-limiting stenosis to the skull base (V1-V3 segments). CTA HEAD FINDINGS POSTERIOR CIRCULATION: --Vertebral arteries: Normal V4 segments. --Inferior cerebellar arteries: Normal. --Basilar artery: Normal. --Superior cerebellar arteries: Normal. --Posterior cerebral arteries (PCA): Normal. ANTERIOR CIRCULATION: --Intracranial internal carotid arteries: Atherosclerotic calcification of the internal carotid arteries at the skull base without hemodynamically significant stenosis. --Anterior cerebral arteries (ACA): Normal. Both A1 segments are present. Patent anterior communicating artery (a-comm). --Middle cerebral arteries (MCA): Normal. VENOUS SINUSES: As permitted by contrast timing, patent. ANATOMIC VARIANTS: None Review of the MIP images confirms the above findings. IMPRESSION: Normal CTA of the head and neck. Electronically Signed   By: Ulyses Jarred M.D.   On: 01/01/2020 03:24    Microbiology: Recent Results (from the past 240 hour(s))  SARS Coronavirus 2 by RT PCR (hospital order, performed in [facility] hospital lab) Nasopharyngeal Nasopharyngeal Swab     Status: None   Collection Time: 01/01/20  6:12 AM  Specimen: Nasopharyngeal Swab  Result Value Ref Range Status   SARS Coronavirus 2  NEGATIVE NEGATIVE Final    Comment: (NOTE) SARS-CoV-2 target nucleic acids are NOT DETECTED.  The SARS-CoV-2 RNA is generally detectable in upper and lower respiratory specimens during the acute phase of infection. The lowest concentration of SARS-CoV-2 viral copies this assay can detect is 250 copies / mL. A negative result does not preclude SARS-CoV-2 infection and should not be used as the sole basis for treatment or other patient management decisions.  A negative result may occur with improper specimen collection / handling, submission of specimen other than nasopharyngeal swab, presence of viral mutation(s) within the areas targeted by this assay, and inadequate number of viral copies (<250 copies / mL). A negative result must be combined with clinical observations, patient history, and epidemiological information.  Fact Sheet for Patients:   StrictlyIdeas.no  Fact Sheet for Healthcare Providers: BankingDealers.co.za  This test is not yet approved or  cleared by the [place] FDA and has been authorized for detection and/or diagnosis of SARS-CoV-2 by FDA under an Emergency Use Authorization (EUA).  This EUA will remain in effect (meaning this test can be used) for the duration of the COVID-19 declaration under Section 564(b)(1) of the Act, 21 U.S.C. section 360bbb-3(b)(1), unless the authorization is terminated or revoked sooner.  Performed at [facility], [place] [address], [place], [place] [address]      Labs: Basic Metabolic Panel: Recent Labs  Lab 01/01/20 0116 01/02/20 1158 01/03/20 0850 01/04/20 0500 01/05/20 0457  NA 140 139 139 136 136  K 4.0 3.5 3.4* 4.6 5.2*  CL 105 103 104 104 106  CO2 17* 24 25 24 23   GLUCOSE 110* 127* 132* 102* 100*  BUN 27* 27* 22 25* 30*  CREATININE 2.92* 2.82* 2.74* 2.73* 3.09*  CALCIUM 6.4* 6.8* 6.9* 7.1* 7.4*  MG  --   --  1.0* 1.7 1.5*  PHOS  --   --  2.9 3.3 3.4    Liver Function Tests: Recent Labs  Lab 01/01/20 0116 01/03/20 0850 01/04/20 0500 01/05/20 0457  AST 20 16 15  14*  ALT 11 9 20 11   ALKPHOS 79 85 85 85  BILITOT 0.4 0.4 0.5 0.2*  PROT 5.7* 5.5* 5.3* 5.4*  ALBUMIN 2.3* 2.0* 1.9* 2.0*   No results for input(s): LIPASE, AMYLASE in the last 168 hours. No results for input(s): AMMONIA in the last 168 hours. CBC: Recent Labs  Lab 01/01/20 0116 01/02/20 1158 01/03/20 0850 01/04/20 0500 01/05/20 0457  WBC 11.1* 8.1 7.3 6.9 7.1  NEUTROABS 7.5  --  4.8 4.2 4.1  HGB 6.6* 7.5* 8.0* 8.2* 8.3*  HCT 22.9* 24.0* 25.0* 26.0* 26.3*  MCV 96.2 88.2 88.3 88.7 88.9  PLT 230 196 184 179 183   Cardiac Enzymes: No results for input(s): CKTOTAL, CKMB, CKMBINDEX, TROPONINI in the last 168 hours. BNP: BNP (last 3 results) No results for input(s): BNP in the last 8760 hours.  ProBNP (last 3 results) No results for input(s): PROBNP in the last 8760 hours.  CBG: Recent Labs  Lab 01/04/20 1805 01/05/20 0034 01/05/20 0615 01/05/20 1221 01/05/20 1825  GLUCAP 105* 111* 94 89 106*       Signed:  Dia Crawford, MD [facility] [phone] pager

## 2020-01-05 NOTE — Progress Notes (Signed)
PT Cancellation Note  Patient Details Name: Reginald Todd MRN: [id number] DOB: 09-16-1933   Cancelled Treatment:    Reason Eval/Treat Not Completed: Other (comment)   Politely declining amb at this time;  Tells me he will be discharging;  Will follow while in-hospital;   Roney Marion, PT  Acute Rehabilitation Services Pager [phone] Office [phone]    Colletta [place] 01/05/2020, 4:46 PM

## 2020-01-05 NOTE — Progress Notes (Signed)
OT Cancellation Note  Patient Details Name: Reginald Todd MRN: [id number] DOB: 08-17-1933   Cancelled Treatment:    Reason Eval/Treat Not Completed: Patient declined, no reason specified. Patient reports "I'm just trying to get this nap" and declines engagement in OT at this time.  Will follow and see as able.   Jolaine Artist, OT Acute Rehabilitation Services Pager [phone] Office [phone]   Delight Stare 01/05/2020, 1:59 PM

## 2020-01-30 DIAGNOSIS — I10 Essential (primary) hypertension: Secondary | ICD-10-CM | POA: Diagnosis not present

## 2020-01-30 DIAGNOSIS — R569 Unspecified convulsions: Secondary | ICD-10-CM | POA: Diagnosis not present

## 2020-01-30 DIAGNOSIS — R404 Transient alteration of awareness: Secondary | ICD-10-CM | POA: Diagnosis not present

## 2020-01-31 DIAGNOSIS — E43 Unspecified severe protein-calorie malnutrition: Secondary | ICD-10-CM | POA: Diagnosis not present

## 2020-01-31 DIAGNOSIS — I618 Other nontraumatic intracerebral hemorrhage: Secondary | ICD-10-CM | POA: Diagnosis not present

## 2020-01-31 DIAGNOSIS — Z681 Body mass index (BMI) 19 or less, adult: Secondary | ICD-10-CM | POA: Diagnosis not present

## 2020-01-31 DIAGNOSIS — I129 Hypertensive chronic kidney disease with stage 1 through stage 4 chronic kidney disease, or unspecified chronic kidney disease: Secondary | ICD-10-CM | POA: Diagnosis not present

## 2020-01-31 DIAGNOSIS — C189 Malignant neoplasm of colon, unspecified: Secondary | ICD-10-CM | POA: Diagnosis not present

## 2020-01-31 DIAGNOSIS — G610 Guillain-Barre syndrome: Secondary | ICD-10-CM | POA: Diagnosis not present

## 2020-01-31 DIAGNOSIS — R569 Unspecified convulsions: Secondary | ICD-10-CM | POA: Diagnosis not present

## 2020-01-31 DIAGNOSIS — I2510 Atherosclerotic heart disease of native coronary artery without angina pectoris: Secondary | ICD-10-CM | POA: Diagnosis not present

## 2020-01-31 DIAGNOSIS — G319 Degenerative disease of nervous system, unspecified: Secondary | ICD-10-CM | POA: Diagnosis not present

## 2020-01-31 DIAGNOSIS — G9341 Metabolic encephalopathy: Secondary | ICD-10-CM | POA: Diagnosis not present

## 2020-01-31 DIAGNOSIS — N184 Chronic kidney disease, stage 4 (severe): Secondary | ICD-10-CM | POA: Diagnosis not present

## 2020-01-31 DIAGNOSIS — I6782 Cerebral ischemia: Secondary | ICD-10-CM | POA: Diagnosis not present

## 2020-02-01 DIAGNOSIS — E43 Unspecified severe protein-calorie malnutrition: Secondary | ICD-10-CM | POA: Diagnosis not present

## 2020-02-01 DIAGNOSIS — G9341 Metabolic encephalopathy: Secondary | ICD-10-CM | POA: Diagnosis not present

## 2020-02-01 DIAGNOSIS — R569 Unspecified convulsions: Secondary | ICD-10-CM | POA: Diagnosis not present

## 2020-02-02 DIAGNOSIS — I618 Other nontraumatic intracerebral hemorrhage: Secondary | ICD-10-CM | POA: Diagnosis not present

## 2020-02-02 DIAGNOSIS — R569 Unspecified convulsions: Secondary | ICD-10-CM | POA: Diagnosis not present

## 2020-02-02 DIAGNOSIS — E43 Unspecified severe protein-calorie malnutrition: Secondary | ICD-10-CM | POA: Diagnosis not present

## 2020-02-02 DIAGNOSIS — G9341 Metabolic encephalopathy: Secondary | ICD-10-CM | POA: Diagnosis not present

## 2020-02-02 DIAGNOSIS — G319 Degenerative disease of nervous system, unspecified: Secondary | ICD-10-CM | POA: Diagnosis not present

## 2020-02-02 DIAGNOSIS — I6782 Cerebral ischemia: Secondary | ICD-10-CM | POA: Diagnosis not present

## 2020-02-03 DIAGNOSIS — R569 Unspecified convulsions: Secondary | ICD-10-CM | POA: Diagnosis not present

## 2020-02-03 DIAGNOSIS — G9341 Metabolic encephalopathy: Secondary | ICD-10-CM | POA: Diagnosis not present

## 2020-02-03 DIAGNOSIS — E43 Unspecified severe protein-calorie malnutrition: Secondary | ICD-10-CM | POA: Diagnosis not present

## 2020-02-04 DIAGNOSIS — G9341 Metabolic encephalopathy: Secondary | ICD-10-CM | POA: Diagnosis not present

## 2020-02-04 DIAGNOSIS — R569 Unspecified convulsions: Secondary | ICD-10-CM | POA: Diagnosis not present

## 2020-02-04 DIAGNOSIS — E43 Unspecified severe protein-calorie malnutrition: Secondary | ICD-10-CM | POA: Diagnosis not present

## 2020-02-06 DIAGNOSIS — C799 Secondary malignant neoplasm of unspecified site: Secondary | ICD-10-CM | POA: Diagnosis not present

## 2020-02-06 DIAGNOSIS — R404 Transient alteration of awareness: Secondary | ICD-10-CM | POA: Diagnosis not present

## 2020-02-06 DIAGNOSIS — G40909 Epilepsy, unspecified, not intractable, without status epilepticus: Secondary | ICD-10-CM | POA: Diagnosis not present

## 2020-02-06 DIAGNOSIS — I129 Hypertensive chronic kidney disease with stage 1 through stage 4 chronic kidney disease, or unspecified chronic kidney disease: Secondary | ICD-10-CM | POA: Diagnosis not present

## 2020-02-06 DIAGNOSIS — F05 Delirium due to known physiological condition: Secondary | ICD-10-CM | POA: Diagnosis not present

## 2020-02-06 DIAGNOSIS — Z681 Body mass index (BMI) 19 or less, adult: Secondary | ICD-10-CM | POA: Diagnosis not present

## 2020-02-06 DIAGNOSIS — D631 Anemia in chronic kidney disease: Secondary | ICD-10-CM | POA: Diagnosis not present

## 2020-02-06 DIAGNOSIS — E161 Other hypoglycemia: Secondary | ICD-10-CM | POA: Diagnosis not present

## 2020-02-06 DIAGNOSIS — Z85038 Personal history of other malignant neoplasm of large intestine: Secondary | ICD-10-CM | POA: Diagnosis not present

## 2020-02-06 DIAGNOSIS — R4020 Unspecified coma: Secondary | ICD-10-CM | POA: Diagnosis not present

## 2020-02-06 DIAGNOSIS — G40801 Other epilepsy, not intractable, with status epilepticus: Secondary | ICD-10-CM | POA: Diagnosis not present

## 2020-02-06 DIAGNOSIS — J9811 Atelectasis: Secondary | ICD-10-CM | POA: Diagnosis not present

## 2020-02-06 DIAGNOSIS — E785 Hyperlipidemia, unspecified: Secondary | ICD-10-CM | POA: Diagnosis not present

## 2020-02-06 DIAGNOSIS — E43 Unspecified severe protein-calorie malnutrition: Secondary | ICD-10-CM | POA: Diagnosis not present

## 2020-02-06 DIAGNOSIS — Z9911 Dependence on respirator [ventilator] status: Secondary | ICD-10-CM | POA: Diagnosis not present

## 2020-02-06 DIAGNOSIS — C189 Malignant neoplasm of colon, unspecified: Secondary | ICD-10-CM | POA: Diagnosis not present

## 2020-02-06 DIAGNOSIS — I2510 Atherosclerotic heart disease of native coronary artery without angina pectoris: Secondary | ICD-10-CM | POA: Diagnosis not present

## 2020-02-06 DIAGNOSIS — Z4682 Encounter for fitting and adjustment of non-vascular catheter: Secondary | ICD-10-CM | POA: Diagnosis not present

## 2020-02-06 DIAGNOSIS — G40901 Epilepsy, unspecified, not intractable, with status epilepticus: Secondary | ICD-10-CM | POA: Diagnosis not present

## 2020-02-06 DIAGNOSIS — R4182 Altered mental status, unspecified: Secondary | ICD-10-CM | POA: Diagnosis not present

## 2020-02-06 DIAGNOSIS — N184 Chronic kidney disease, stage 4 (severe): Secondary | ICD-10-CM | POA: Diagnosis not present

## 2020-02-06 DIAGNOSIS — Z951 Presence of aortocoronary bypass graft: Secondary | ICD-10-CM | POA: Diagnosis not present

## 2020-02-06 DIAGNOSIS — R531 Weakness: Secondary | ICD-10-CM | POA: Diagnosis not present

## 2020-02-06 DIAGNOSIS — Z452 Encounter for adjustment and management of vascular access device: Secondary | ICD-10-CM | POA: Diagnosis not present

## 2020-02-06 DIAGNOSIS — R918 Other nonspecific abnormal finding of lung field: Secondary | ICD-10-CM | POA: Diagnosis not present

## 2020-02-06 DIAGNOSIS — I499 Cardiac arrhythmia, unspecified: Secondary | ICD-10-CM | POA: Diagnosis not present

## 2020-02-06 DIAGNOSIS — R52 Pain, unspecified: Secondary | ICD-10-CM | POA: Diagnosis not present

## 2020-02-06 DIAGNOSIS — N189 Chronic kidney disease, unspecified: Secondary | ICD-10-CM | POA: Diagnosis not present

## 2020-02-06 DIAGNOSIS — R569 Unspecified convulsions: Secondary | ICD-10-CM | POA: Diagnosis not present

## 2020-02-06 DIAGNOSIS — M2550 Pain in unspecified joint: Secondary | ICD-10-CM | POA: Diagnosis not present

## 2020-02-06 DIAGNOSIS — Z8673 Personal history of transient ischemic attack (TIA), and cerebral infarction without residual deficits: Secondary | ICD-10-CM | POA: Diagnosis not present

## 2020-02-06 DIAGNOSIS — Z7401 Bed confinement status: Secondary | ICD-10-CM | POA: Diagnosis not present

## 2020-02-07 DIAGNOSIS — G9340 Encephalopathy, unspecified: Secondary | ICD-10-CM | POA: Diagnosis not present

## 2020-02-07 DIAGNOSIS — G40901 Epilepsy, unspecified, not intractable, with status epilepticus: Secondary | ICD-10-CM | POA: Diagnosis not present

## 2020-02-07 DIAGNOSIS — I499 Cardiac arrhythmia, unspecified: Secondary | ICD-10-CM | POA: Diagnosis not present

## 2020-02-07 DIAGNOSIS — R52 Pain, unspecified: Secondary | ICD-10-CM | POA: Diagnosis not present

## 2020-02-07 DIAGNOSIS — Z85038 Personal history of other malignant neoplasm of large intestine: Secondary | ICD-10-CM | POA: Diagnosis not present

## 2020-02-07 DIAGNOSIS — B004 Herpesviral encephalitis: Secondary | ICD-10-CM | POA: Diagnosis not present

## 2020-02-07 DIAGNOSIS — I2510 Atherosclerotic heart disease of native coronary artery without angina pectoris: Secondary | ICD-10-CM | POA: Diagnosis not present

## 2020-02-07 DIAGNOSIS — Z8673 Personal history of transient ischemic attack (TIA), and cerebral infarction without residual deficits: Secondary | ICD-10-CM | POA: Diagnosis not present

## 2020-02-07 DIAGNOSIS — J9811 Atelectasis: Secondary | ICD-10-CM | POA: Diagnosis not present

## 2020-02-07 DIAGNOSIS — I82623 Acute embolism and thrombosis of deep veins of upper extremity, bilateral: Secondary | ICD-10-CM | POA: Diagnosis not present

## 2020-02-07 DIAGNOSIS — I129 Hypertensive chronic kidney disease with stage 1 through stage 4 chronic kidney disease, or unspecified chronic kidney disease: Secondary | ICD-10-CM | POA: Diagnosis not present

## 2020-02-07 DIAGNOSIS — R404 Transient alteration of awareness: Secondary | ICD-10-CM | POA: Diagnosis not present

## 2020-02-07 DIAGNOSIS — G039 Meningitis, unspecified: Secondary | ICD-10-CM | POA: Diagnosis not present

## 2020-02-07 DIAGNOSIS — Z4682 Encounter for fitting and adjustment of non-vascular catheter: Secondary | ICD-10-CM | POA: Diagnosis not present

## 2020-02-07 DIAGNOSIS — R34 Anuria and oliguria: Secondary | ICD-10-CM | POA: Diagnosis not present

## 2020-02-07 DIAGNOSIS — R569 Unspecified convulsions: Secondary | ICD-10-CM | POA: Diagnosis not present

## 2020-02-07 DIAGNOSIS — E43 Unspecified severe protein-calorie malnutrition: Secondary | ICD-10-CM | POA: Diagnosis not present

## 2020-02-07 DIAGNOSIS — R253 Fasciculation: Secondary | ICD-10-CM | POA: Diagnosis not present

## 2020-02-07 DIAGNOSIS — Z9911 Dependence on respirator [ventilator] status: Secondary | ICD-10-CM | POA: Diagnosis not present

## 2020-02-07 DIAGNOSIS — Z515 Encounter for palliative care: Secondary | ICD-10-CM | POA: Diagnosis not present

## 2020-02-07 DIAGNOSIS — M2550 Pain in unspecified joint: Secondary | ICD-10-CM | POA: Diagnosis not present

## 2020-02-07 DIAGNOSIS — D649 Anemia, unspecified: Secondary | ICD-10-CM | POA: Diagnosis not present

## 2020-02-07 DIAGNOSIS — Z9981 Dependence on supplemental oxygen: Secondary | ICD-10-CM | POA: Diagnosis not present

## 2020-02-07 DIAGNOSIS — I82613 Acute embolism and thrombosis of superficial veins of upper extremity, bilateral: Secondary | ICD-10-CM | POA: Diagnosis not present

## 2020-02-07 DIAGNOSIS — E161 Other hypoglycemia: Secondary | ICD-10-CM | POA: Diagnosis not present

## 2020-02-07 DIAGNOSIS — E875 Hyperkalemia: Secondary | ICD-10-CM | POA: Diagnosis not present

## 2020-02-07 DIAGNOSIS — I82290 Acute embolism and thrombosis of other thoracic veins: Secondary | ICD-10-CM | POA: Diagnosis not present

## 2020-02-07 DIAGNOSIS — E876 Hypokalemia: Secondary | ICD-10-CM | POA: Diagnosis not present

## 2020-02-07 DIAGNOSIS — A879 Viral meningitis, unspecified: Secondary | ICD-10-CM | POA: Diagnosis not present

## 2020-02-07 DIAGNOSIS — Z452 Encounter for adjustment and management of vascular access device: Secondary | ICD-10-CM | POA: Diagnosis not present

## 2020-02-07 DIAGNOSIS — R9401 Abnormal electroencephalogram [EEG]: Secondary | ICD-10-CM | POA: Diagnosis not present

## 2020-02-07 DIAGNOSIS — R579 Shock, unspecified: Secondary | ICD-10-CM | POA: Diagnosis not present

## 2020-02-07 DIAGNOSIS — R918 Other nonspecific abnormal finding of lung field: Secondary | ICD-10-CM | POA: Diagnosis not present

## 2020-02-07 DIAGNOSIS — Z8679 Personal history of other diseases of the circulatory system: Secondary | ICD-10-CM | POA: Diagnosis not present

## 2020-02-07 DIAGNOSIS — E870 Hyperosmolality and hypernatremia: Secondary | ICD-10-CM | POA: Diagnosis not present

## 2020-02-07 DIAGNOSIS — N189 Chronic kidney disease, unspecified: Secondary | ICD-10-CM | POA: Diagnosis not present

## 2020-02-07 DIAGNOSIS — D631 Anemia in chronic kidney disease: Secondary | ICD-10-CM | POA: Diagnosis not present

## 2020-02-07 DIAGNOSIS — G40801 Other epilepsy, not intractable, with status epilepticus: Secondary | ICD-10-CM | POA: Diagnosis not present

## 2020-02-07 DIAGNOSIS — J90 Pleural effusion, not elsewhere classified: Secondary | ICD-10-CM | POA: Diagnosis not present

## 2020-02-07 DIAGNOSIS — I82612 Acute embolism and thrombosis of superficial veins of left upper extremity: Secondary | ICD-10-CM | POA: Diagnosis not present

## 2020-02-07 DIAGNOSIS — N179 Acute kidney failure, unspecified: Secondary | ICD-10-CM | POA: Diagnosis not present

## 2020-02-07 DIAGNOSIS — B003 Herpesviral meningitis: Secondary | ICD-10-CM | POA: Diagnosis not present

## 2020-02-07 DIAGNOSIS — G40909 Epilepsy, unspecified, not intractable, without status epilepticus: Secondary | ICD-10-CM | POA: Diagnosis not present

## 2020-02-07 DIAGNOSIS — Z66 Do not resuscitate: Secondary | ICD-10-CM | POA: Diagnosis not present

## 2020-02-07 DIAGNOSIS — F05 Delirium due to known physiological condition: Secondary | ICD-10-CM | POA: Diagnosis not present

## 2020-02-07 DIAGNOSIS — Z7401 Bed confinement status: Secondary | ICD-10-CM | POA: Diagnosis not present

## 2020-02-07 DIAGNOSIS — Z7409 Other reduced mobility: Secondary | ICD-10-CM | POA: Diagnosis not present

## 2020-02-07 DIAGNOSIS — C189 Malignant neoplasm of colon, unspecified: Secondary | ICD-10-CM | POA: Diagnosis not present

## 2020-02-07 DIAGNOSIS — E785 Hyperlipidemia, unspecified: Secondary | ICD-10-CM | POA: Diagnosis not present

## 2020-02-07 DIAGNOSIS — Z951 Presence of aortocoronary bypass graft: Secondary | ICD-10-CM | POA: Diagnosis not present

## 2020-02-07 DIAGNOSIS — R531 Weakness: Secondary | ICD-10-CM | POA: Diagnosis not present

## 2020-02-07 DIAGNOSIS — Z743 Need for continuous supervision: Secondary | ICD-10-CM | POA: Diagnosis not present

## 2020-02-07 DIAGNOSIS — N170 Acute kidney failure with tubular necrosis: Secondary | ICD-10-CM | POA: Diagnosis not present

## 2020-02-08 DIAGNOSIS — R569 Unspecified convulsions: Secondary | ICD-10-CM | POA: Diagnosis not present

## 2020-02-08 DIAGNOSIS — Z8673 Personal history of transient ischemic attack (TIA), and cerebral infarction without residual deficits: Secondary | ICD-10-CM | POA: Diagnosis not present

## 2020-02-08 DIAGNOSIS — N189 Chronic kidney disease, unspecified: Secondary | ICD-10-CM | POA: Diagnosis not present

## 2020-02-08 DIAGNOSIS — Z9911 Dependence on respirator [ventilator] status: Secondary | ICD-10-CM | POA: Diagnosis not present

## 2020-02-08 DIAGNOSIS — G039 Meningitis, unspecified: Secondary | ICD-10-CM | POA: Diagnosis not present

## 2020-02-08 DIAGNOSIS — I129 Hypertensive chronic kidney disease with stage 1 through stage 4 chronic kidney disease, or unspecified chronic kidney disease: Secondary | ICD-10-CM | POA: Diagnosis not present

## 2020-02-08 DIAGNOSIS — G40901 Epilepsy, unspecified, not intractable, with status epilepticus: Secondary | ICD-10-CM | POA: Diagnosis not present

## 2020-02-08 DIAGNOSIS — Z85038 Personal history of other malignant neoplasm of large intestine: Secondary | ICD-10-CM | POA: Diagnosis not present

## 2020-02-08 DIAGNOSIS — Z8679 Personal history of other diseases of the circulatory system: Secondary | ICD-10-CM | POA: Diagnosis not present

## 2020-02-09 DIAGNOSIS — G40901 Epilepsy, unspecified, not intractable, with status epilepticus: Secondary | ICD-10-CM | POA: Diagnosis not present

## 2020-02-09 DIAGNOSIS — Z85038 Personal history of other malignant neoplasm of large intestine: Secondary | ICD-10-CM | POA: Diagnosis not present

## 2020-02-09 DIAGNOSIS — G9340 Encephalopathy, unspecified: Secondary | ICD-10-CM | POA: Diagnosis not present

## 2020-02-09 DIAGNOSIS — R579 Shock, unspecified: Secondary | ICD-10-CM | POA: Diagnosis not present

## 2020-02-09 DIAGNOSIS — N189 Chronic kidney disease, unspecified: Secondary | ICD-10-CM | POA: Diagnosis not present

## 2020-02-09 DIAGNOSIS — I129 Hypertensive chronic kidney disease with stage 1 through stage 4 chronic kidney disease, or unspecified chronic kidney disease: Secondary | ICD-10-CM | POA: Diagnosis not present

## 2020-02-09 DIAGNOSIS — Z8679 Personal history of other diseases of the circulatory system: Secondary | ICD-10-CM | POA: Diagnosis not present

## 2020-02-09 DIAGNOSIS — R569 Unspecified convulsions: Secondary | ICD-10-CM | POA: Diagnosis not present

## 2020-02-09 DIAGNOSIS — D631 Anemia in chronic kidney disease: Secondary | ICD-10-CM | POA: Diagnosis not present

## 2020-02-09 DIAGNOSIS — E876 Hypokalemia: Secondary | ICD-10-CM | POA: Diagnosis not present

## 2020-02-09 DIAGNOSIS — Z8673 Personal history of transient ischemic attack (TIA), and cerebral infarction without residual deficits: Secondary | ICD-10-CM | POA: Diagnosis not present

## 2020-02-09 DIAGNOSIS — Z9911 Dependence on respirator [ventilator] status: Secondary | ICD-10-CM | POA: Diagnosis not present

## 2020-02-10 DIAGNOSIS — D649 Anemia, unspecified: Secondary | ICD-10-CM | POA: Diagnosis not present

## 2020-02-10 DIAGNOSIS — N189 Chronic kidney disease, unspecified: Secondary | ICD-10-CM | POA: Diagnosis not present

## 2020-02-10 DIAGNOSIS — Z515 Encounter for palliative care: Secondary | ICD-10-CM | POA: Diagnosis not present

## 2020-02-10 DIAGNOSIS — R579 Shock, unspecified: Secondary | ICD-10-CM | POA: Diagnosis not present

## 2020-02-10 DIAGNOSIS — Z9911 Dependence on respirator [ventilator] status: Secondary | ICD-10-CM | POA: Diagnosis not present

## 2020-02-10 DIAGNOSIS — G40901 Epilepsy, unspecified, not intractable, with status epilepticus: Secondary | ICD-10-CM | POA: Diagnosis not present

## 2020-02-10 DIAGNOSIS — C189 Malignant neoplasm of colon, unspecified: Secondary | ICD-10-CM | POA: Diagnosis not present

## 2020-02-10 DIAGNOSIS — N179 Acute kidney failure, unspecified: Secondary | ICD-10-CM | POA: Diagnosis not present

## 2020-02-10 DIAGNOSIS — Z85038 Personal history of other malignant neoplasm of large intestine: Secondary | ICD-10-CM | POA: Diagnosis not present

## 2020-02-10 DIAGNOSIS — R34 Anuria and oliguria: Secondary | ICD-10-CM | POA: Diagnosis not present

## 2020-02-10 DIAGNOSIS — I129 Hypertensive chronic kidney disease with stage 1 through stage 4 chronic kidney disease, or unspecified chronic kidney disease: Secondary | ICD-10-CM | POA: Diagnosis not present

## 2020-02-10 DIAGNOSIS — R9401 Abnormal electroencephalogram [EEG]: Secondary | ICD-10-CM | POA: Diagnosis not present

## 2020-02-10 DIAGNOSIS — A879 Viral meningitis, unspecified: Secondary | ICD-10-CM | POA: Diagnosis not present

## 2020-02-11 DIAGNOSIS — I82612 Acute embolism and thrombosis of superficial veins of left upper extremity: Secondary | ICD-10-CM | POA: Diagnosis not present

## 2020-02-11 DIAGNOSIS — C189 Malignant neoplasm of colon, unspecified: Secondary | ICD-10-CM | POA: Diagnosis not present

## 2020-02-11 DIAGNOSIS — N189 Chronic kidney disease, unspecified: Secondary | ICD-10-CM | POA: Diagnosis not present

## 2020-02-11 DIAGNOSIS — A879 Viral meningitis, unspecified: Secondary | ICD-10-CM | POA: Diagnosis not present

## 2020-02-11 DIAGNOSIS — I82290 Acute embolism and thrombosis of other thoracic veins: Secondary | ICD-10-CM | POA: Diagnosis not present

## 2020-02-11 DIAGNOSIS — I82623 Acute embolism and thrombosis of deep veins of upper extremity, bilateral: Secondary | ICD-10-CM | POA: Diagnosis not present

## 2020-02-11 DIAGNOSIS — Z9911 Dependence on respirator [ventilator] status: Secondary | ICD-10-CM | POA: Diagnosis not present

## 2020-02-11 DIAGNOSIS — R579 Shock, unspecified: Secondary | ICD-10-CM | POA: Diagnosis not present

## 2020-02-11 DIAGNOSIS — Z7409 Other reduced mobility: Secondary | ICD-10-CM | POA: Diagnosis not present

## 2020-02-11 DIAGNOSIS — I129 Hypertensive chronic kidney disease with stage 1 through stage 4 chronic kidney disease, or unspecified chronic kidney disease: Secondary | ICD-10-CM | POA: Diagnosis not present

## 2020-02-11 DIAGNOSIS — R34 Anuria and oliguria: Secondary | ICD-10-CM | POA: Diagnosis not present

## 2020-02-11 DIAGNOSIS — Z85038 Personal history of other malignant neoplasm of large intestine: Secondary | ICD-10-CM | POA: Diagnosis not present

## 2020-02-11 DIAGNOSIS — N179 Acute kidney failure, unspecified: Secondary | ICD-10-CM | POA: Diagnosis not present

## 2020-02-11 DIAGNOSIS — Z515 Encounter for palliative care: Secondary | ICD-10-CM | POA: Diagnosis not present

## 2020-02-11 DIAGNOSIS — G40901 Epilepsy, unspecified, not intractable, with status epilepticus: Secondary | ICD-10-CM | POA: Diagnosis not present

## 2020-02-11 DIAGNOSIS — I82613 Acute embolism and thrombosis of superficial veins of upper extremity, bilateral: Secondary | ICD-10-CM | POA: Diagnosis not present

## 2020-02-12 DIAGNOSIS — I129 Hypertensive chronic kidney disease with stage 1 through stage 4 chronic kidney disease, or unspecified chronic kidney disease: Secondary | ICD-10-CM | POA: Diagnosis not present

## 2020-02-12 DIAGNOSIS — I82623 Acute embolism and thrombosis of deep veins of upper extremity, bilateral: Secondary | ICD-10-CM | POA: Diagnosis not present

## 2020-02-12 DIAGNOSIS — Z9911 Dependence on respirator [ventilator] status: Secondary | ICD-10-CM | POA: Diagnosis not present

## 2020-02-12 DIAGNOSIS — G40901 Epilepsy, unspecified, not intractable, with status epilepticus: Secondary | ICD-10-CM | POA: Diagnosis not present

## 2020-02-12 DIAGNOSIS — A879 Viral meningitis, unspecified: Secondary | ICD-10-CM | POA: Diagnosis not present

## 2020-02-12 DIAGNOSIS — N179 Acute kidney failure, unspecified: Secondary | ICD-10-CM | POA: Diagnosis not present

## 2020-02-12 DIAGNOSIS — N189 Chronic kidney disease, unspecified: Secondary | ICD-10-CM | POA: Diagnosis not present

## 2020-02-12 DIAGNOSIS — C189 Malignant neoplasm of colon, unspecified: Secondary | ICD-10-CM | POA: Diagnosis not present

## 2020-02-12 DIAGNOSIS — Z85038 Personal history of other malignant neoplasm of large intestine: Secondary | ICD-10-CM | POA: Diagnosis not present

## 2020-02-13 DIAGNOSIS — Z452 Encounter for adjustment and management of vascular access device: Secondary | ICD-10-CM | POA: Diagnosis not present

## 2020-02-13 DIAGNOSIS — I129 Hypertensive chronic kidney disease with stage 1 through stage 4 chronic kidney disease, or unspecified chronic kidney disease: Secondary | ICD-10-CM | POA: Diagnosis not present

## 2020-02-13 DIAGNOSIS — Z85038 Personal history of other malignant neoplasm of large intestine: Secondary | ICD-10-CM | POA: Diagnosis not present

## 2020-02-13 DIAGNOSIS — N189 Chronic kidney disease, unspecified: Secondary | ICD-10-CM | POA: Diagnosis not present

## 2020-02-13 DIAGNOSIS — E870 Hyperosmolality and hypernatremia: Secondary | ICD-10-CM | POA: Diagnosis not present

## 2020-02-13 DIAGNOSIS — C189 Malignant neoplasm of colon, unspecified: Secondary | ICD-10-CM | POA: Diagnosis not present

## 2020-02-13 DIAGNOSIS — N179 Acute kidney failure, unspecified: Secondary | ICD-10-CM | POA: Diagnosis not present

## 2020-02-13 DIAGNOSIS — Z515 Encounter for palliative care: Secondary | ICD-10-CM | POA: Diagnosis not present

## 2020-02-13 DIAGNOSIS — Z9911 Dependence on respirator [ventilator] status: Secondary | ICD-10-CM | POA: Diagnosis not present

## 2020-02-13 DIAGNOSIS — E875 Hyperkalemia: Secondary | ICD-10-CM | POA: Diagnosis not present

## 2020-02-13 DIAGNOSIS — I82623 Acute embolism and thrombosis of deep veins of upper extremity, bilateral: Secondary | ICD-10-CM | POA: Diagnosis not present

## 2020-02-13 DIAGNOSIS — B004 Herpesviral encephalitis: Secondary | ICD-10-CM | POA: Diagnosis not present

## 2020-02-13 DIAGNOSIS — G40901 Epilepsy, unspecified, not intractable, with status epilepticus: Secondary | ICD-10-CM | POA: Diagnosis not present

## 2020-02-13 DIAGNOSIS — Z4682 Encounter for fitting and adjustment of non-vascular catheter: Secondary | ICD-10-CM | POA: Diagnosis not present

## 2020-02-13 DIAGNOSIS — I2510 Atherosclerotic heart disease of native coronary artery without angina pectoris: Secondary | ICD-10-CM | POA: Diagnosis not present

## 2020-02-14 DIAGNOSIS — E875 Hyperkalemia: Secondary | ICD-10-CM | POA: Diagnosis not present

## 2020-02-14 DIAGNOSIS — Z8673 Personal history of transient ischemic attack (TIA), and cerebral infarction without residual deficits: Secondary | ICD-10-CM | POA: Diagnosis not present

## 2020-02-14 DIAGNOSIS — N189 Chronic kidney disease, unspecified: Secondary | ICD-10-CM | POA: Diagnosis not present

## 2020-02-14 DIAGNOSIS — Z9911 Dependence on respirator [ventilator] status: Secondary | ICD-10-CM | POA: Diagnosis not present

## 2020-02-14 DIAGNOSIS — Z85038 Personal history of other malignant neoplasm of large intestine: Secondary | ICD-10-CM | POA: Diagnosis not present

## 2020-02-14 DIAGNOSIS — N179 Acute kidney failure, unspecified: Secondary | ICD-10-CM | POA: Diagnosis not present

## 2020-02-14 DIAGNOSIS — R253 Fasciculation: Secondary | ICD-10-CM | POA: Diagnosis not present

## 2020-02-14 DIAGNOSIS — I82623 Acute embolism and thrombosis of deep veins of upper extremity, bilateral: Secondary | ICD-10-CM | POA: Diagnosis not present

## 2020-02-14 DIAGNOSIS — I129 Hypertensive chronic kidney disease with stage 1 through stage 4 chronic kidney disease, or unspecified chronic kidney disease: Secondary | ICD-10-CM | POA: Diagnosis not present

## 2020-02-14 DIAGNOSIS — G40901 Epilepsy, unspecified, not intractable, with status epilepticus: Secondary | ICD-10-CM | POA: Diagnosis not present

## 2020-02-15 DIAGNOSIS — R52 Pain, unspecified: Secondary | ICD-10-CM | POA: Diagnosis not present

## 2020-02-15 DIAGNOSIS — E876 Hypokalemia: Secondary | ICD-10-CM | POA: Diagnosis not present

## 2020-02-15 DIAGNOSIS — I129 Hypertensive chronic kidney disease with stage 1 through stage 4 chronic kidney disease, or unspecified chronic kidney disease: Secondary | ICD-10-CM | POA: Diagnosis not present

## 2020-02-15 DIAGNOSIS — N179 Acute kidney failure, unspecified: Secondary | ICD-10-CM | POA: Diagnosis not present

## 2020-02-15 DIAGNOSIS — Z9911 Dependence on respirator [ventilator] status: Secondary | ICD-10-CM | POA: Diagnosis not present

## 2020-02-15 DIAGNOSIS — Z8673 Personal history of transient ischemic attack (TIA), and cerebral infarction without residual deficits: Secondary | ICD-10-CM | POA: Diagnosis not present

## 2020-02-15 DIAGNOSIS — Z8679 Personal history of other diseases of the circulatory system: Secondary | ICD-10-CM | POA: Diagnosis not present

## 2020-02-15 DIAGNOSIS — I82623 Acute embolism and thrombosis of deep veins of upper extremity, bilateral: Secondary | ICD-10-CM | POA: Diagnosis not present

## 2020-02-15 DIAGNOSIS — G40901 Epilepsy, unspecified, not intractable, with status epilepticus: Secondary | ICD-10-CM | POA: Diagnosis not present

## 2020-02-15 DIAGNOSIS — N189 Chronic kidney disease, unspecified: Secondary | ICD-10-CM | POA: Diagnosis not present

## 2020-02-15 DIAGNOSIS — Z85038 Personal history of other malignant neoplasm of large intestine: Secondary | ICD-10-CM | POA: Diagnosis not present

## 2020-02-16 DIAGNOSIS — Z743 Need for continuous supervision: Secondary | ICD-10-CM | POA: Diagnosis not present

## 2020-02-16 DIAGNOSIS — R531 Weakness: Secondary | ICD-10-CM | POA: Diagnosis not present

## 2020-02-16 DIAGNOSIS — Z9911 Dependence on respirator [ventilator] status: Secondary | ICD-10-CM | POA: Diagnosis not present

## 2020-02-16 DIAGNOSIS — Z9981 Dependence on supplemental oxygen: Secondary | ICD-10-CM | POA: Diagnosis not present

## 2020-03-10 ENCOUNTER — Ambulatory Visit: Payer: Medicare HMO | Admitting: Diagnostic Neuroimaging

## 2020-03-10 ENCOUNTER — Telehealth: Payer: Self-pay | Admitting: *Deleted

## 2020-03-10 ENCOUNTER — Encounter: Payer: Self-pay | Admitting: Diagnostic Neuroimaging

## 2020-03-10 NOTE — Telephone Encounter (Signed)
Patient was no show for new patient appointment today. 

## 2020-03-15 DEATH — deceased

## 2021-11-14 IMAGING — MR MR HEAD W/O CM
8 of 12 series · 26 of 48 positions shown · non-contrast
Comparison: CT head and CTA head and neck earlier today.

CLINICAL DATA: 85-year-old male with history of colon cancer status
post chemotherapy in [REDACTED]. Confusion with right side gaze preference
1166 hours yesterday.

EXAM:
MRI HEAD WITHOUT CONTRAST
TECHNIQUE: Multiplanar, multiecho pulse sequences of the brain and surrounding
structures were obtained without intravenous contrast.

[Series 3: DWI · axial · 3.0mm · 0.94mm/px · z∈[-95,+42]mm · 6 of 94 slices shown (1 of 2)]
[im 1/94]
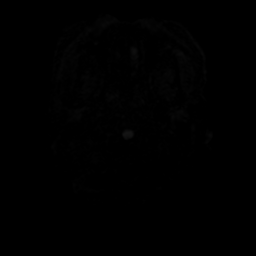
[im 19/94]
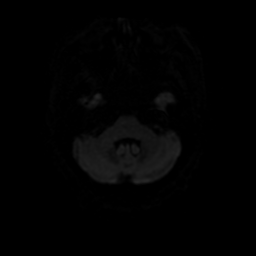
[im 38/94]
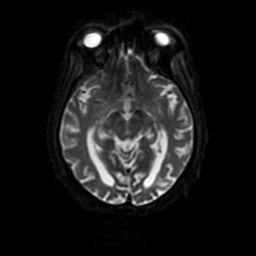
[im 56/94]
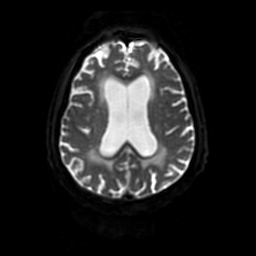
[im 75/94]
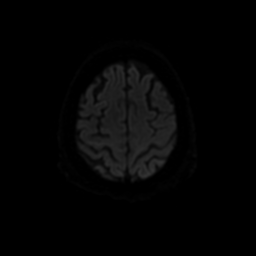
[im 94/94]
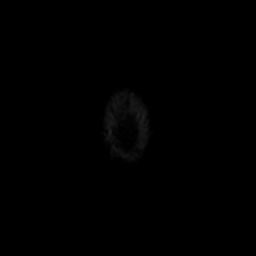

[Series 4: DWI · coronal · 4.0mm · 0.94mm/px · 5 of 70 slices shown (2 of 2)]
[im 1/70]
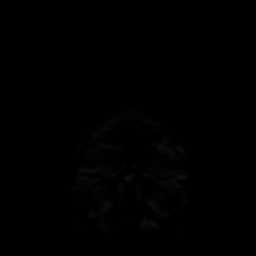
[im 18/70]
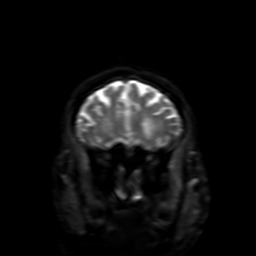
[im 35/70]
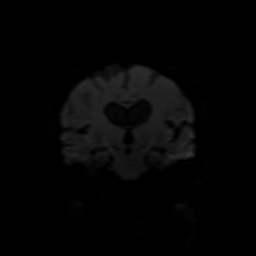
[im 52/70]
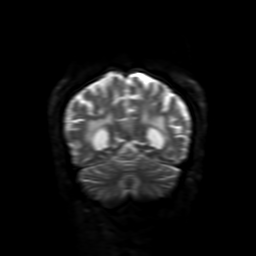
[im 70/70]
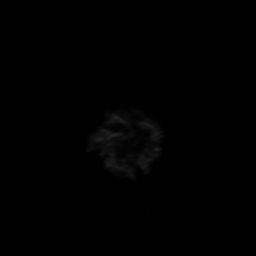

[Series 5: T2 · axial · 5.0mm · 0.43mm/px · z∈[-104,+38]mm · 2 of 25 slices shown]
[im 1/25]
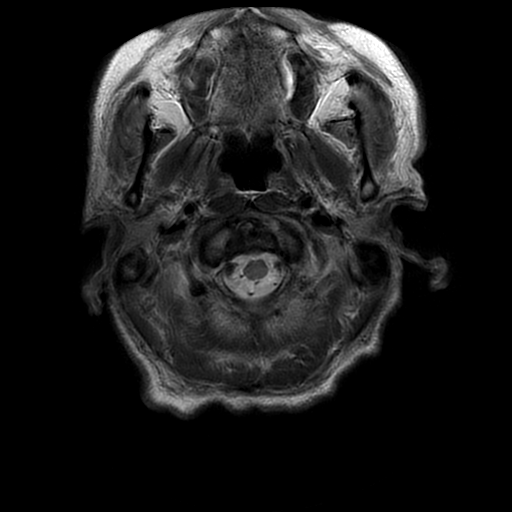
[im 25/25]
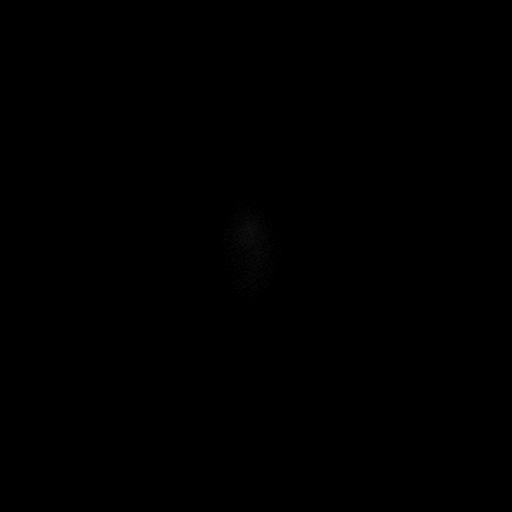

[Series 6: FLAIR · axial · 3.0mm · 0.43mm/px · z∈[-103,+33]mm · 2 of 24 slices shown (1 of 3)]
[im 1/24]
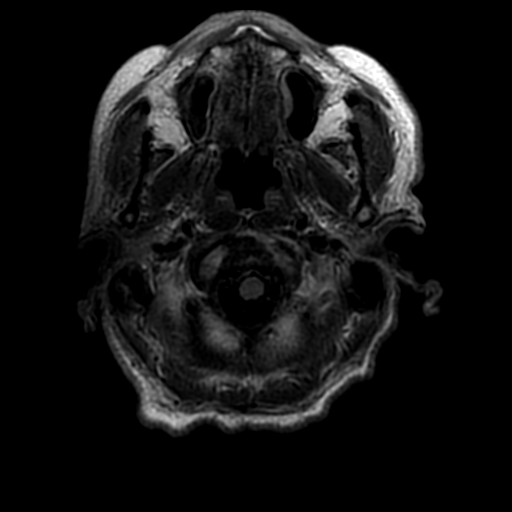
[im 24/24]
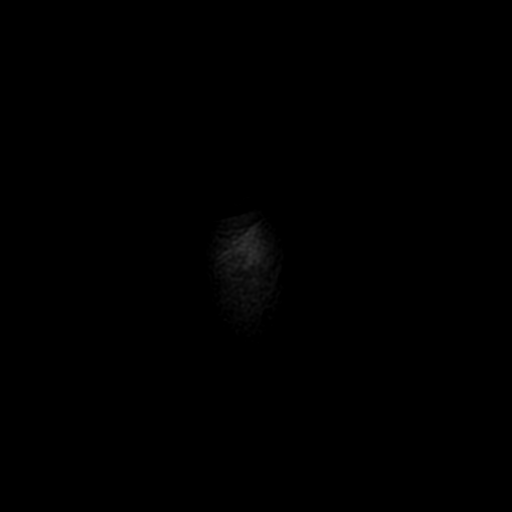

[Series 7: FLAIR · sagittal · 5.0mm · 0.23mm/px · 2 of 24 slices shown (2 of 3)]
[im 1/24]
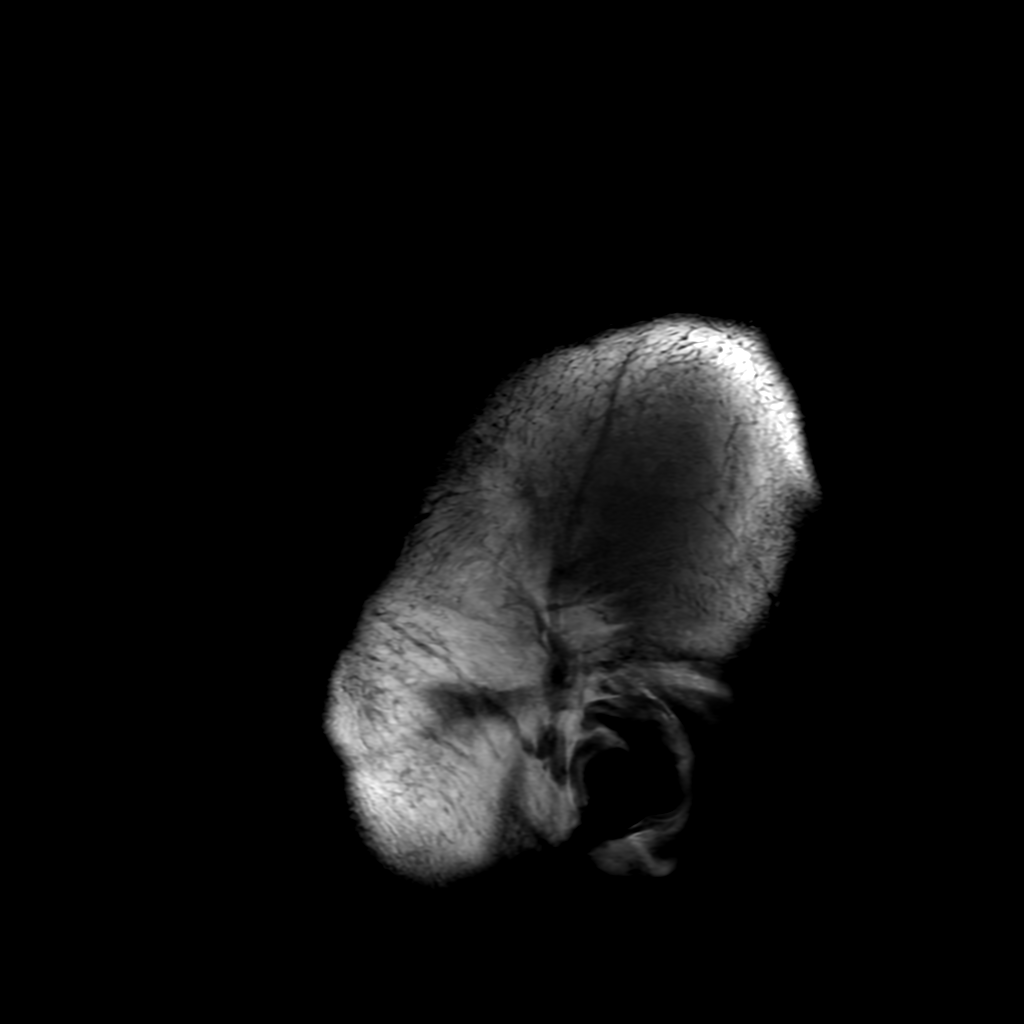
[im 24/24]
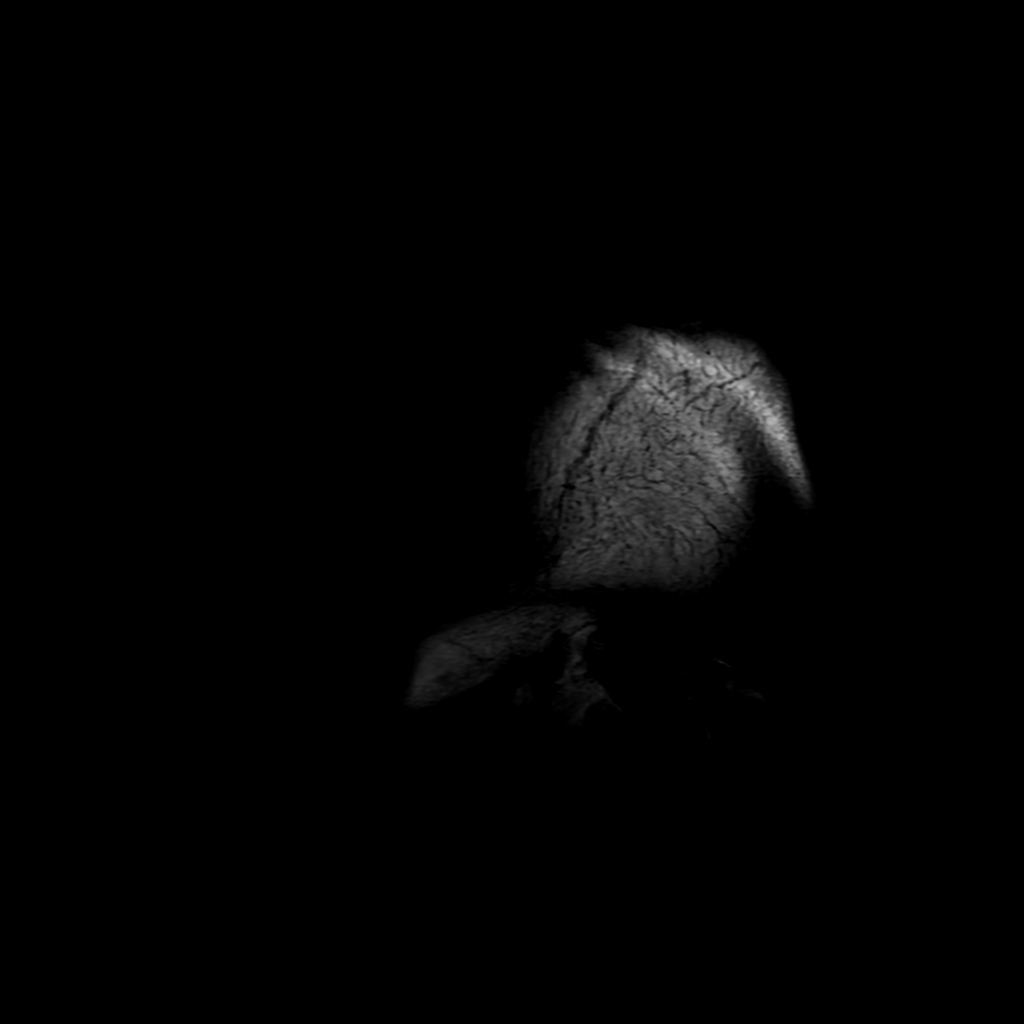

[Series 11: FLAIR · coronal · 3.0mm · 0.43mm/px · 2 of 24 slices shown (3 of 3)]
[im 1/24]
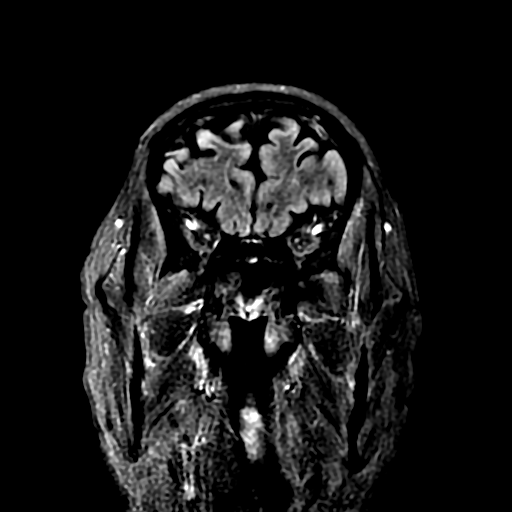
[im 24/24]
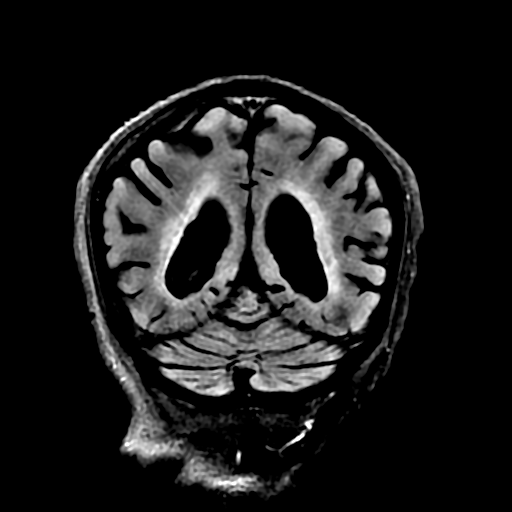

[Series 350: ADC · axial · 3.0mm · 0.94mm/px · z∈[-95,+42]mm · 4 of 47 slices shown (1 of 2)]
[im 1/47]
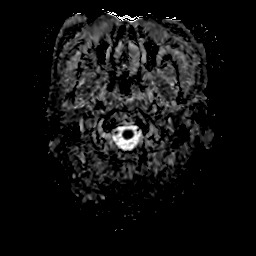
[im 16/47]
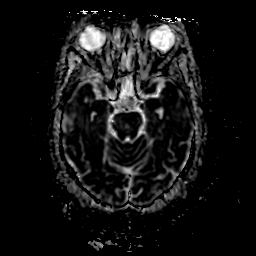
[im 31/47]
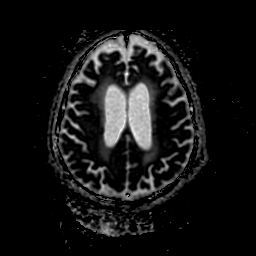
[im 47/47]
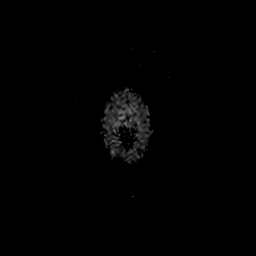

[Series 450: ADC · coronal · 4.0mm · 0.94mm/px · 3 of 35 slices shown (2 of 2)]
[im 1/35]
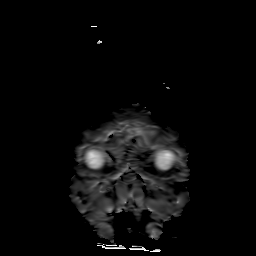
[im 18/35]
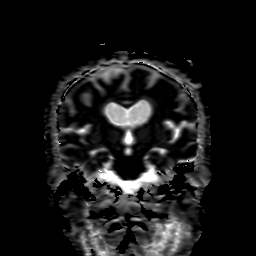
[im 35/35]
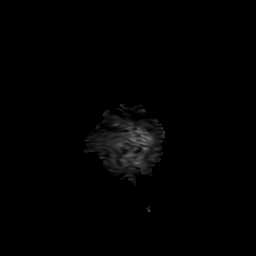

[26 of 48 positions shown; findings below may reference images not displayed]

FINDINGS: Brain: No restricted diffusion or evidence of acute infarction.
Small chronic infarcts in the left cerebellum. Small area of chronic
cortical encephalomalacia in the left lateral occipital lobe.
Occasional chronic micro hemorrhages, including in the right
posterior lentiform on series 8, image 37.

Hippocampal formations and mesial temporal lobes appear symmetric
and within normal limits for age.

Patchy and confluent bilateral cerebral white matter T2 and FLAIR
hyperintensity. T2 heterogeneity in the bilateral basal ganglia
appears more related to perivascular spaces. Mild T2 heterogeneity
in the pons.

No midline shift, mass effect, evidence of mass lesion,
ventriculomegaly, extra-axial collection or acute intracranial
hemorrhage. Cervicomedullary junction and pituitary are within
normal limits.

Vascular: Major intracranial vascular flow voids are preserved.

Skull and upper cervical spine: Negative for age visible cervical
spine, bone marrow signal.

Sinuses/Orbits: Postoperative changes to both globes. Otherwise
negative orbits. Trace paranasal sinus mucosal thickening.

Other: Mastoids are clear. Visible internal auditory structures
appear normal. Trace retained secretions in the nasopharynx.
IMPRESSION: 1. No acute intracranial abnormality.
2. Small chronic infarcts in the left cerebellum and left occipital
lobe.
3. Moderate for age signal changes in the cerebral white matter,
basal ganglia, and pons, most commonly due to chronic small vessel
disease.
